# Patient Record
Sex: Female | Born: 1940
Health system: Southern US, Community
[De-identification: ages and names within clinical notes are randomized; demographics above are authoritative.]

## PROBLEM LIST (undated history)

## (undated) DIAGNOSIS — M1990 Unspecified osteoarthritis, unspecified site: Secondary | ICD-10-CM

## (undated) DIAGNOSIS — I10 Essential (primary) hypertension: Secondary | ICD-10-CM

## (undated) DIAGNOSIS — K769 Liver disease, unspecified: Secondary | ICD-10-CM

## (undated) DIAGNOSIS — E039 Hypothyroidism, unspecified: Secondary | ICD-10-CM

## (undated) DIAGNOSIS — E079 Disorder of thyroid, unspecified: Secondary | ICD-10-CM

## (undated) DIAGNOSIS — E119 Type 2 diabetes mellitus without complications: Secondary | ICD-10-CM

## (undated) DIAGNOSIS — I219 Acute myocardial infarction, unspecified: Secondary | ICD-10-CM

## (undated) HISTORY — PX: JOINT REPLACEMENT: SHX530

## (undated) HISTORY — PX: APPENDECTOMY: SHX54

## (undated) HISTORY — PX: ABDOMINAL HYSTERECTOMY: SHX81

---

## 2014-03-23 DIAGNOSIS — Z961 Presence of intraocular lens: Secondary | ICD-10-CM | POA: Diagnosis not present

## 2014-03-23 DIAGNOSIS — E119 Type 2 diabetes mellitus without complications: Secondary | ICD-10-CM | POA: Diagnosis not present

## 2014-03-23 DIAGNOSIS — D3190 Benign neoplasm of unspecified part of unspecified eye: Secondary | ICD-10-CM | POA: Diagnosis not present

## 2014-04-17 DIAGNOSIS — Z961 Presence of intraocular lens: Secondary | ICD-10-CM | POA: Diagnosis not present

## 2014-06-22 DIAGNOSIS — I10 Essential (primary) hypertension: Secondary | ICD-10-CM | POA: Diagnosis not present

## 2014-06-22 DIAGNOSIS — N183 Chronic kidney disease, stage 3 (moderate): Secondary | ICD-10-CM | POA: Diagnosis not present

## 2014-06-22 DIAGNOSIS — E039 Hypothyroidism, unspecified: Secondary | ICD-10-CM | POA: Diagnosis not present

## 2014-06-22 DIAGNOSIS — J209 Acute bronchitis, unspecified: Secondary | ICD-10-CM | POA: Diagnosis not present

## 2014-07-18 DIAGNOSIS — M79672 Pain in left foot: Secondary | ICD-10-CM | POA: Diagnosis not present

## 2014-07-23 DIAGNOSIS — E119 Type 2 diabetes mellitus without complications: Secondary | ICD-10-CM | POA: Diagnosis not present

## 2014-07-23 DIAGNOSIS — E780 Pure hypercholesterolemia: Secondary | ICD-10-CM | POA: Diagnosis not present

## 2014-07-23 DIAGNOSIS — I10 Essential (primary) hypertension: Secondary | ICD-10-CM | POA: Diagnosis not present

## 2014-07-23 DIAGNOSIS — E039 Hypothyroidism, unspecified: Secondary | ICD-10-CM | POA: Diagnosis not present

## 2014-08-02 DIAGNOSIS — M069 Rheumatoid arthritis, unspecified: Secondary | ICD-10-CM | POA: Diagnosis not present

## 2014-08-02 DIAGNOSIS — M0579 Rheumatoid arthritis with rheumatoid factor of multiple sites without organ or systems involvement: Secondary | ICD-10-CM | POA: Diagnosis not present

## 2014-08-02 DIAGNOSIS — M810 Age-related osteoporosis without current pathological fracture: Secondary | ICD-10-CM | POA: Diagnosis not present

## 2014-08-02 DIAGNOSIS — Z79899 Other long term (current) drug therapy: Secondary | ICD-10-CM | POA: Diagnosis not present

## 2014-08-22 DIAGNOSIS — E780 Pure hypercholesterolemia: Secondary | ICD-10-CM | POA: Diagnosis not present

## 2014-08-22 DIAGNOSIS — E039 Hypothyroidism, unspecified: Secondary | ICD-10-CM | POA: Diagnosis not present

## 2014-08-22 DIAGNOSIS — E119 Type 2 diabetes mellitus without complications: Secondary | ICD-10-CM | POA: Diagnosis not present

## 2014-08-30 DIAGNOSIS — M069 Rheumatoid arthritis, unspecified: Secondary | ICD-10-CM | POA: Diagnosis not present

## 2014-09-04 DIAGNOSIS — M810 Age-related osteoporosis without current pathological fracture: Secondary | ICD-10-CM | POA: Diagnosis not present

## 2014-09-22 DIAGNOSIS — K589 Irritable bowel syndrome without diarrhea: Secondary | ICD-10-CM | POA: Diagnosis not present

## 2014-09-22 DIAGNOSIS — E782 Mixed hyperlipidemia: Secondary | ICD-10-CM | POA: Diagnosis not present

## 2014-09-22 DIAGNOSIS — E039 Hypothyroidism, unspecified: Secondary | ICD-10-CM | POA: Diagnosis not present

## 2014-09-22 DIAGNOSIS — E119 Type 2 diabetes mellitus without complications: Secondary | ICD-10-CM | POA: Diagnosis not present

## 2014-09-27 DIAGNOSIS — M069 Rheumatoid arthritis, unspecified: Secondary | ICD-10-CM | POA: Diagnosis not present

## 2014-10-02 DIAGNOSIS — E119 Type 2 diabetes mellitus without complications: Secondary | ICD-10-CM | POA: Diagnosis not present

## 2014-10-02 DIAGNOSIS — E039 Hypothyroidism, unspecified: Secondary | ICD-10-CM | POA: Diagnosis not present

## 2014-10-02 DIAGNOSIS — I10 Essential (primary) hypertension: Secondary | ICD-10-CM | POA: Diagnosis not present

## 2014-10-02 DIAGNOSIS — Z79899 Other long term (current) drug therapy: Secondary | ICD-10-CM | POA: Diagnosis not present

## 2014-10-02 DIAGNOSIS — E1169 Type 2 diabetes mellitus with other specified complication: Secondary | ICD-10-CM | POA: Diagnosis not present

## 2014-10-02 DIAGNOSIS — E780 Pure hypercholesterolemia: Secondary | ICD-10-CM | POA: Diagnosis not present

## 2014-10-10 DIAGNOSIS — Z1231 Encounter for screening mammogram for malignant neoplasm of breast: Secondary | ICD-10-CM | POA: Diagnosis not present

## 2014-10-25 DIAGNOSIS — M069 Rheumatoid arthritis, unspecified: Secondary | ICD-10-CM | POA: Diagnosis not present

## 2014-10-25 DIAGNOSIS — Z79899 Other long term (current) drug therapy: Secondary | ICD-10-CM | POA: Diagnosis not present

## 2014-11-17 DIAGNOSIS — Z23 Encounter for immunization: Secondary | ICD-10-CM | POA: Diagnosis not present

## 2014-11-22 DIAGNOSIS — M810 Age-related osteoporosis without current pathological fracture: Secondary | ICD-10-CM | POA: Diagnosis not present

## 2014-11-22 DIAGNOSIS — M069 Rheumatoid arthritis, unspecified: Secondary | ICD-10-CM | POA: Diagnosis not present

## 2014-12-05 DIAGNOSIS — E785 Hyperlipidemia, unspecified: Secondary | ICD-10-CM | POA: Diagnosis not present

## 2014-12-05 DIAGNOSIS — E119 Type 2 diabetes mellitus without complications: Secondary | ICD-10-CM | POA: Diagnosis not present

## 2014-12-05 DIAGNOSIS — I2510 Atherosclerotic heart disease of native coronary artery without angina pectoris: Secondary | ICD-10-CM | POA: Diagnosis not present

## 2014-12-05 DIAGNOSIS — I10 Essential (primary) hypertension: Secondary | ICD-10-CM | POA: Diagnosis not present

## 2014-12-20 DIAGNOSIS — M069 Rheumatoid arthritis, unspecified: Secondary | ICD-10-CM | POA: Diagnosis not present

## 2014-12-26 DIAGNOSIS — N3091 Cystitis, unspecified with hematuria: Secondary | ICD-10-CM | POA: Diagnosis not present

## 2014-12-26 DIAGNOSIS — N3090 Cystitis, unspecified without hematuria: Secondary | ICD-10-CM | POA: Diagnosis not present

## 2015-01-01 DIAGNOSIS — M545 Low back pain: Secondary | ICD-10-CM | POA: Diagnosis not present

## 2015-01-01 DIAGNOSIS — E039 Hypothyroidism, unspecified: Secondary | ICD-10-CM | POA: Diagnosis not present

## 2015-01-18 DIAGNOSIS — M069 Rheumatoid arthritis, unspecified: Secondary | ICD-10-CM | POA: Diagnosis not present

## 2015-01-19 DIAGNOSIS — R8299 Other abnormal findings in urine: Secondary | ICD-10-CM | POA: Diagnosis not present

## 2015-01-19 DIAGNOSIS — N390 Urinary tract infection, site not specified: Secondary | ICD-10-CM | POA: Diagnosis not present

## 2015-01-19 DIAGNOSIS — M545 Low back pain: Secondary | ICD-10-CM | POA: Diagnosis not present

## 2015-01-19 DIAGNOSIS — R0781 Pleurodynia: Secondary | ICD-10-CM | POA: Diagnosis not present

## 2015-01-19 DIAGNOSIS — S22070A Wedge compression fracture of T9-T10 vertebra, initial encounter for closed fracture: Secondary | ICD-10-CM | POA: Diagnosis not present

## 2015-01-19 DIAGNOSIS — M5136 Other intervertebral disc degeneration, lumbar region: Secondary | ICD-10-CM | POA: Diagnosis not present

## 2015-01-19 DIAGNOSIS — E039 Hypothyroidism, unspecified: Secondary | ICD-10-CM | POA: Diagnosis not present

## 2017-08-05 ENCOUNTER — Emergency Department (HOSPITAL_COMMUNITY): Payer: Medicare Other

## 2017-08-05 ENCOUNTER — Encounter (HOSPITAL_COMMUNITY): Payer: Self-pay | Admitting: Emergency Medicine

## 2017-08-05 ENCOUNTER — Inpatient Hospital Stay (HOSPITAL_COMMUNITY)
Admission: EM | Admit: 2017-08-05 | Discharge: 2017-08-08 | DRG: 481 | Disposition: A | Discharge status: Home Health | Payer: Medicare Other | Attending: Internal Medicine | Admitting: Internal Medicine

## 2017-08-05 ENCOUNTER — Other Ambulatory Visit: Payer: Self-pay

## 2017-08-05 DIAGNOSIS — S7290XA Unspecified fracture of unspecified femur, initial encounter for closed fracture: Secondary | ICD-10-CM | POA: Diagnosis present

## 2017-08-05 DIAGNOSIS — Z888 Allergy status to other drugs, medicaments and biological substances status: Secondary | ICD-10-CM

## 2017-08-05 DIAGNOSIS — S81812A Laceration without foreign body, left lower leg, initial encounter: Secondary | ICD-10-CM | POA: Diagnosis not present

## 2017-08-05 DIAGNOSIS — S72332A Displaced oblique fracture of shaft of left femur, initial encounter for closed fracture: Principal | ICD-10-CM | POA: Diagnosis present

## 2017-08-05 DIAGNOSIS — K219 Gastro-esophageal reflux disease without esophagitis: Secondary | ICD-10-CM | POA: Diagnosis present

## 2017-08-05 DIAGNOSIS — E1151 Type 2 diabetes mellitus with diabetic peripheral angiopathy without gangrene: Secondary | ICD-10-CM | POA: Diagnosis present

## 2017-08-05 DIAGNOSIS — S52502A Unspecified fracture of the lower end of left radius, initial encounter for closed fracture: Secondary | ICD-10-CM | POA: Diagnosis present

## 2017-08-05 DIAGNOSIS — S61512A Laceration without foreign body of left wrist, initial encounter: Secondary | ICD-10-CM | POA: Diagnosis present

## 2017-08-05 DIAGNOSIS — I2510 Atherosclerotic heart disease of native coronary artery without angina pectoris: Secondary | ICD-10-CM | POA: Diagnosis present

## 2017-08-05 DIAGNOSIS — W109XXA Fall (on) (from) unspecified stairs and steps, initial encounter: Secondary | ICD-10-CM | POA: Diagnosis present

## 2017-08-05 DIAGNOSIS — I10 Essential (primary) hypertension: Secondary | ICD-10-CM | POA: Diagnosis present

## 2017-08-05 DIAGNOSIS — D62 Acute posthemorrhagic anemia: Secondary | ICD-10-CM | POA: Diagnosis not present

## 2017-08-05 DIAGNOSIS — Z886 Allergy status to analgesic agent status: Secondary | ICD-10-CM

## 2017-08-05 DIAGNOSIS — I252 Old myocardial infarction: Secondary | ICD-10-CM

## 2017-08-05 DIAGNOSIS — E039 Hypothyroidism, unspecified: Secondary | ICD-10-CM | POA: Diagnosis present

## 2017-08-05 DIAGNOSIS — W19XXXA Unspecified fall, initial encounter: Secondary | ICD-10-CM | POA: Diagnosis present

## 2017-08-05 DIAGNOSIS — T1490XA Injury, unspecified, initial encounter: Secondary | ICD-10-CM

## 2017-08-05 DIAGNOSIS — Z885 Allergy status to narcotic agent status: Secondary | ICD-10-CM

## 2017-08-05 DIAGNOSIS — E119 Type 2 diabetes mellitus without complications: Secondary | ICD-10-CM

## 2017-08-05 DIAGNOSIS — E663 Overweight: Secondary | ICD-10-CM | POA: Diagnosis present

## 2017-08-05 DIAGNOSIS — Z419 Encounter for procedure for purposes other than remedying health state, unspecified: Secondary | ICD-10-CM

## 2017-08-05 DIAGNOSIS — S52572A Other intraarticular fracture of lower end of left radius, initial encounter for closed fracture: Secondary | ICD-10-CM | POA: Diagnosis present

## 2017-08-05 DIAGNOSIS — Z79899 Other long term (current) drug therapy: Secondary | ICD-10-CM

## 2017-08-05 DIAGNOSIS — S52612A Displaced fracture of left ulna styloid process, initial encounter for closed fracture: Secondary | ICD-10-CM | POA: Diagnosis present

## 2017-08-05 DIAGNOSIS — Z7982 Long term (current) use of aspirin: Secondary | ICD-10-CM

## 2017-08-05 DIAGNOSIS — Z87891 Personal history of nicotine dependence: Secondary | ICD-10-CM

## 2017-08-05 DIAGNOSIS — Z23 Encounter for immunization: Secondary | ICD-10-CM

## 2017-08-05 DIAGNOSIS — D72829 Elevated white blood cell count, unspecified: Secondary | ICD-10-CM | POA: Diagnosis present

## 2017-08-05 DIAGNOSIS — Z6829 Body mass index (BMI) 29.0-29.9, adult: Secondary | ICD-10-CM

## 2017-08-05 DIAGNOSIS — R718 Other abnormality of red blood cells: Secondary | ICD-10-CM | POA: Diagnosis present

## 2017-08-05 DIAGNOSIS — J449 Chronic obstructive pulmonary disease, unspecified: Secondary | ICD-10-CM | POA: Diagnosis present

## 2017-08-05 DIAGNOSIS — Z7984 Long term (current) use of oral hypoglycemic drugs: Secondary | ICD-10-CM

## 2017-08-05 DIAGNOSIS — S52592A Other fractures of lower end of left radius, initial encounter for closed fracture: Secondary | ICD-10-CM

## 2017-08-05 HISTORY — DX: Essential (primary) hypertension: I10

## 2017-08-05 HISTORY — DX: Liver disease, unspecified: K76.9

## 2017-08-05 HISTORY — DX: Disorder of thyroid, unspecified: E07.9

## 2017-08-05 HISTORY — DX: Unspecified osteoarthritis, unspecified site: M19.90

## 2017-08-05 HISTORY — DX: Type 2 diabetes mellitus without complications: E11.9

## 2017-08-05 HISTORY — DX: Hypothyroidism, unspecified: E03.9

## 2017-08-05 HISTORY — DX: Acute myocardial infarction, unspecified: I21.9

## 2017-08-05 MED ORDER — ONDANSETRON HCL 4 MG/2ML IJ SOLN
4.0000 mg | Freq: Once | INTRAMUSCULAR | Status: AC
  Administered 2017-08-05: 4 mg via INTRAVENOUS
  Filled 2017-08-05: qty 2

## 2017-08-05 MED ORDER — TETANUS-DIPHTH-ACELL PERTUSSIS 5-2.5-18.5 LF-MCG/0.5 IM SUSP
0.5000 mL | Freq: Once | INTRAMUSCULAR | Status: AC
  Administered 2017-08-05: 0.5 mL via INTRAMUSCULAR
  Filled 2017-08-05: qty 0.5

## 2017-08-05 MED ORDER — FENTANYL CITRATE (PF) 100 MCG/2ML IJ SOLN
50.0000 ug | Freq: Once | INTRAMUSCULAR | Status: AC
Start: 2017-08-05 — End: 2017-08-05
  Administered 2017-08-05: 50 ug via INTRAVENOUS
  Filled 2017-08-05: qty 2

## 2017-08-05 NOTE — ED Triage Notes (Signed)
Pt reports she was on the last step of the stairs at her granddaughters house when she went to spin, felt a pain in her left femur area, and then fell. Pt has a hx of a hip fx back in January. Pt denies any LOC.

## 2017-08-05 NOTE — ED Provider Notes (Signed)
[facility] Provider Note   CSN: [id number] Arrival date & time: 08/05/17  2247     History   Chief Complaint Chief Complaint  Patient presents with  . Fall  . Possible Femur Fx    HPI Crystal Pham is a 77 y.o. female.  The history is provided by the patient and a relative.  Fall  This is a new problem. The current episode started 1 to 2 hours ago. The problem occurs constantly. The problem has been gradually worsening. Pertinent negatives include no chest pain, no abdominal pain and no headaches. The symptoms are aggravated by walking. The symptoms are relieved by rest.   Patient presents after fall. She reports losing her balance on stairs and falling on her left side.  She reports significant pain in her left thigh.  She also reports pain in her left shin.  She did hit her head, but denies LOC.  Denies headache, neck pain, back pain.  No vomiting prior to EMS transport.  No chest pain or abdominal pain.  She does not take anticoagulants She reports previous history of right hip surgery in [place] earlier this year Past Medical History:  Diagnosis Date  . Arthritis   . Diabetes mellitus without complication (HCC)    Type II  . Heart attack (HCC)   . Hypertension   . Liver disorder   . Thyroid disease     There are no active problems to display for this patient.   Past Surgical History:  Procedure Laterality Date  . ABDOMINAL HYSTERECTOMY    . APPENDECTOMY    . JOINT REPLACEMENT       OB History   None      Home Medications    Prior to Admission medications   Not on File    Family History No family history on file.  Social History Social History   Tobacco Use  . Smoking status: Former Games developermoker  . Smokeless tobacco: Never Used  Substance Use Topics  . Alcohol use: Never    Frequency: Never  . Drug use: Never     Allergies   Oxycodone; Promethazine; and Levofloxacin   Review of Systems Review of  Systems  Constitutional: Negative for fever.  Cardiovascular: Negative for chest pain.  Gastrointestinal: Negative for abdominal pain.  Musculoskeletal: Positive for arthralgias. Negative for back pain and neck pain.  Skin: Positive for wound.  Neurological: Negative for syncope and headaches.  All other systems reviewed and are negative.    Physical Exam Updated Vital Signs BP (!) 146/76 (BP Location: Left Arm)   Pulse 79   Temp 98.2 F (36.8 C) (Oral)   Resp 18   Ht 1.524 m (5')   Wt 68.9 kg (152 lb)   SpO2 93%   BMI 29.69 kg/m   Physical Exam CONSTITUTIONAL: Elderly, no acute distress HEAD: Normocephalic/atraumatic EYES: EOMI/PERRL ENMT: Mucous membranes moist, small abrasion to bridge of nose.  No facial tenderness.  No malocclusion.  No other nasal injury noted NECK: supple no meningeal signs SPINE/BACK:entire spine nontender no bruising/crepitance/stepoffs noted to spine CV: S1/S2 noted, no murmurs/rubs/gallops noted LUNGS: Lungs are clear to auscultation bilaterally, no apparent distress ABDOMEN: soft, nontender, no bruising NEURO: Pt is awake/alert/appropriate, moves all extremitiesx4.  No facial droop.   EXTREMITIES: pulses normal/equal both feet Left lower extremity is in traction.  Distal pulses intact.  Diffuse tenderness to left thigh.  No lacerations noted.  Diffuse tenderness to left tibial surface.  No  deformities.  There is no left foot tenderness.  Full range of motion of right hip without difficulty 1 cm laceration ulnar aspect of left wrist.  No other wrist tenderness All other extremities/joints palpated/ranged and nontender SKIN: warm, color normal PSYCH: no abnormalities of mood noted, alert and oriented to situation    Patient gave verbal permission to utilize photo for medical documentation only The image was not stored on any personal device  ED Treatments / Results  Labs (all labs ordered are listed, but only abnormal results are  displayed) Labs Reviewed  BASIC METABOLIC PANEL - Abnormal; Notable for the following components:      Result Value   Glucose, Bld 153 (*)    GFR calc non Af Amer 55 (*)    All other components within normal limits  CBC WITH DIFFERENTIAL/PLATELET - Abnormal; Notable for the following components:   WBC 12.6 (*)    RBC 3.46 (*)    Hemoglobin 10.9 (*)    HCT 35.1 (*)    MCV 101.4 (*)    Neutro Abs 8.9 (*)    Monocytes Absolute 1.5 (*)    Abs Immature Granulocytes 0.2 (*)    All other components within normal limits  PROTIME-INR  TYPE AND SCREEN  ABO/RH    EKG EKG Interpretation  Date/Time:  Thursday August 06 2017 00:57:19 EDT Ventricular Rate:  75 PR Interval:    QRS Duration: 80 QT Interval:  403 QTC Calculation: 451 R Axis:   49 Text Interpretation:  Sinus rhythm No previous ECGs available Confirmed by Zadie Rhine ([id number]) on 08/06/2017 1:01:27 AM   Radiology Dg Wrist Complete Left  Result Date: 08/06/2017 CLINICAL DATA:  77 y/o  F; fall with wrist pain. EXAM: LEFT WRIST - COMPLETE 3+ VIEW COMPARISON:  None. FINDINGS: Acute comminuted intra-articular impacted fracture of the distal radius with mild dorsal apex angulation. Acute mildly displaced fracture of the ulnar styloid. Normal radiocarpal articulation. No additional fracture or dislocation identified. Bones are demineralized. Vascular calcifications noted. IMPRESSION: 1. Acute comminuted intra-articular impacted fracture of distal radius with mild dorsal apex angulation. 2. Acute mildly displaced fracture of the ulnar styloid. Electronically Signed   By: Mitzi Hansen M.D.   On: 08/06/2017 01:07   Dg Tibia/fibula Left  Result Date: 08/06/2017 CLINICAL DATA:  77 y/o  F; fall with left hip pain. EXAM: DG HIP (WITH OR WITHOUT PELVIS) 2-3V LEFT; LEFT FEMUR 2 VIEWS; LEFT TIBIA AND FIBULA - 2 VIEW COMPARISON:  None. FINDINGS: Left hip: Partially visualized right femur intramedullary nail and proximal lag screw, no  periprosthetic lucency or fracture identified. Hip joints are well maintained. No pelvic fracture or diastasis. Degenerative changes of the lower lumbar spine. Oblique overriding angulated and 1 shaft's with displaced fracture of the mid left femur diaphysis. Left femur: Acute oblique fracture of the mid left femur diaphysis with 5 cm overriding, mild dorsal and medial apex angulation, and 1 shaft's width posterior displacement of the distal fracture component. Knee joint is grossly maintained. Left tibia and fibula: No acute fracture or dislocation of the tibia and fibula. Partially visualized knee and ankle joints are grossly maintained. IMPRESSION: 1. Acute oblique fracture of the mid left femur diaphysis with 5 cm overriding, mild dorsal and medial apex angulation, and 1 shaft's width posterior displacement of the distal fracture component. 2. No additional fracture or dislocation identified. Electronically Signed   By: Mitzi Hansen M.D.   On: 08/06/2017 01:05   Dg Hip Unilat With Pelvis 2-3  Views Left  Result Date: 08/06/2017 CLINICAL DATA:  77 y/o  F; fall with left hip pain. EXAM: DG HIP (WITH OR WITHOUT PELVIS) 2-3V LEFT; LEFT FEMUR 2 VIEWS; LEFT TIBIA AND FIBULA - 2 VIEW COMPARISON:  None. FINDINGS: Left hip: Partially visualized right femur intramedullary nail and proximal lag screw, no periprosthetic lucency or fracture identified. Hip joints are well maintained. No pelvic fracture or diastasis. Degenerative changes of the lower lumbar spine. Oblique overriding angulated and 1 shaft's with displaced fracture of the mid left femur diaphysis. Left femur: Acute oblique fracture of the mid left femur diaphysis with 5 cm overriding, mild dorsal and medial apex angulation, and 1 shaft's width posterior displacement of the distal fracture component. Knee joint is grossly maintained. Left tibia and fibula: No acute fracture or dislocation of the tibia and fibula. Partially visualized knee and  ankle joints are grossly maintained. IMPRESSION: 1. Acute oblique fracture of the mid left femur diaphysis with 5 cm overriding, mild dorsal and medial apex angulation, and 1 shaft's width posterior displacement of the distal fracture component. 2. No additional fracture or dislocation identified. Electronically Signed   By: Mitzi Hansen M.D.   On: 08/06/2017 01:05   Dg Femur Min 2 Views Left  Result Date: 08/06/2017 CLINICAL DATA:  77 y/o  F; fall with left hip pain. EXAM: DG HIP (WITH OR WITHOUT PELVIS) 2-3V LEFT; LEFT FEMUR 2 VIEWS; LEFT TIBIA AND FIBULA - 2 VIEW COMPARISON:  None. FINDINGS: Left hip: Partially visualized right femur intramedullary nail and proximal lag screw, no periprosthetic lucency or fracture identified. Hip joints are well maintained. No pelvic fracture or diastasis. Degenerative changes of the lower lumbar spine. Oblique overriding angulated and 1 shaft's with displaced fracture of the mid left femur diaphysis. Left femur: Acute oblique fracture of the mid left femur diaphysis with 5 cm overriding, mild dorsal and medial apex angulation, and 1 shaft's width posterior displacement of the distal fracture component. Knee joint is grossly maintained. Left tibia and fibula: No acute fracture or dislocation of the tibia and fibula. Partially visualized knee and ankle joints are grossly maintained. IMPRESSION: 1. Acute oblique fracture of the mid left femur diaphysis with 5 cm overriding, mild dorsal and medial apex angulation, and 1 shaft's width posterior displacement of the distal fracture component. 2. No additional fracture or dislocation identified. Electronically Signed   By: Mitzi Hansen M.D.   On: 08/06/2017 01:05    Procedures .Marland KitchenLaceration Repair Date/Time: 08/06/2017 1:53 AM Performed by: Zadie Rhine, MD Authorized by: Zadie Rhine, MD   Consent:    Consent obtained:  Verbal   Consent given by:  Patient Anesthesia (see MAR for exact  dosages):    Anesthesia method:  Local infiltration   Local anesthetic:  Lidocaine 1% WITH epi Laceration details:    Location:  Hand   Hand location:  L wrist   Length (cm):  1 Repair type:    Repair type:  Intermediate Pre-procedure details:    Preparation:  Patient was prepped and draped in usual sterile fashion Exploration:    Wound exploration: entire depth of wound probed and visualized     Contaminated: no   Treatment:    Area cleansed with:  Betadine and Shur-Clens   Amount of cleaning:  Standard Skin repair:    Repair method:  Sutures   Suture size:  4-0   Suture material:  Prolene   Suture technique:  Simple interrupted   Number of sutures:  4 Approximation:  Approximation:  Close Post-procedure details:    Patient tolerance of procedure:  Tolerated well, no immediate complications Comments:     No bone or deep structures exposed  SPLINT APPLICATION Date/Time: 2:49 AM Authorized by: Joya Gaskins Consent: Verbal consent obtained. Risks and benefits: risks, benefits and alternatives were discussed Consent given by: patient Splint applied by: orthopedic technician Location details: left wrist Splint type: sugartong Supplies used: ortho glass Post-procedure: The splinted body part was neurovascularly unchanged following the procedure. Patient tolerance: Patient tolerated the procedure well with no immediate complications.    Medications Ordered in ED Medications  Tdap (BOOSTRIX) injection 0.5 mL (0.5 mLs Intramuscular Given 08/05/17 2333)  fentaNYL (SUBLIMAZE) injection 50 mcg (50 mcg Intravenous Given 08/05/17 2334)  ondansetron (ZOFRAN) injection 4 mg (4 mg Intravenous Given 08/05/17 2334)  ondansetron (ZOFRAN) injection 4 mg (4 mg Intravenous Given 08/06/17 0052)  lidocaine-EPINEPHrine (XYLOCAINE W/EPI) 2 %-1:200000 (PF) injection 10 mL (10 mLs Infiltration Given 08/06/17 0052)  ceFAZolin (ANCEF) IVPB 1 g/50 mL premix (1 g Intravenous New Bag/Given  08/06/17 0201)  fentaNYL (SUBLIMAZE) injection 50 mcg (50 mcg Intravenous Given 08/06/17 0155)     Initial Impression / Assessment and Plan / ED Course  I have reviewed the triage vital signs and the nursing notes.  Pertinent labs & imaging results that were available during my care of the patient were reviewed by me and considered in my medical decision making (see chart for details).     11:25 PM Patient presents after fall.  Per EMS she had a left thigh deformity and traction was placed in route.  I will leave this in place for now as patient is comfortable.  Will obtain imaging. Defer CT head/C-spine as she has no C-spine tenderness and no LOC or headache. She will need laceration repair of left wrist. She did vomit in route, but this is mainly after receiving pain medicine from EMS 12:37 AM Discussed with Dr. Jena Gauss with orthopedics.  He recommends Buck's traction 10 pounds.  Admit to medicine he will see later this morning, plan for surgery today. 1:52 AM Found to have minimally displaced distal radius fracture, as well as ulnar styloid fracture.  She has a laceration on the ulnar surface of left wrist, there is no bone exposed. Repair of the wound without incident.  We will consult hand. 2:49 AM Discussed with Dr. Mina Marble concerning her left distal radius fracture.  We discussed location of laceration, and fracture. No evidence of obvious open fracture. She will be splinted.  He requested to speak with Dr. Jena Gauss concerning potentially having this managed at the same time as her left femur fracture later today  Patient is resting comfortably, she has had Buck's traction applied and is doing well with distal pulses intact.  She also has sugar tong splint to left arm and is resting comfortably  Discussed with Dr. Clyde Lundborg for admission Final Clinical Impressions(s) / ED Diagnoses   Final diagnoses:  Fall, initial encounter  Closed displaced oblique fracture of shaft of left femur,  initial encounter (HCC)  Laceration of left wrist, initial encounter  Other closed fracture of distal end of left radius, initial encounter    ED Discharge Orders    None       Zadie Rhine, MD 08/06/17 [id number]

## 2017-08-06 ENCOUNTER — Encounter (HOSPITAL_COMMUNITY): Payer: Self-pay | Admitting: Internal Medicine

## 2017-08-06 ENCOUNTER — Emergency Department (HOSPITAL_COMMUNITY): Payer: Medicare Other

## 2017-08-06 ENCOUNTER — Inpatient Hospital Stay (HOSPITAL_COMMUNITY): Payer: Medicare Other

## 2017-08-06 ENCOUNTER — Inpatient Hospital Stay (HOSPITAL_COMMUNITY): Payer: Medicare Other | Admitting: Anesthesiology

## 2017-08-06 ENCOUNTER — Encounter (HOSPITAL_COMMUNITY)
Admission: EM | Disposition: A | Discharge status: Home Health | Payer: Self-pay | Source: Home / Self Care | Attending: Internal Medicine

## 2017-08-06 DIAGNOSIS — E119 Type 2 diabetes mellitus without complications: Secondary | ICD-10-CM | POA: Insufficient documentation

## 2017-08-06 DIAGNOSIS — D72829 Elevated white blood cell count, unspecified: Secondary | ICD-10-CM

## 2017-08-06 DIAGNOSIS — R718 Other abnormality of red blood cells: Secondary | ICD-10-CM | POA: Diagnosis present

## 2017-08-06 DIAGNOSIS — J449 Chronic obstructive pulmonary disease, unspecified: Secondary | ICD-10-CM | POA: Diagnosis present

## 2017-08-06 DIAGNOSIS — I2510 Atherosclerotic heart disease of native coronary artery without angina pectoris: Secondary | ICD-10-CM | POA: Diagnosis present

## 2017-08-06 DIAGNOSIS — Z6829 Body mass index (BMI) 29.0-29.9, adult: Secondary | ICD-10-CM | POA: Diagnosis not present

## 2017-08-06 DIAGNOSIS — E039 Hypothyroidism, unspecified: Secondary | ICD-10-CM | POA: Diagnosis present

## 2017-08-06 DIAGNOSIS — S52502K Unspecified fracture of the lower end of left radius, subsequent encounter for closed fracture with nonunion: Secondary | ICD-10-CM

## 2017-08-06 DIAGNOSIS — E663 Overweight: Secondary | ICD-10-CM | POA: Diagnosis present

## 2017-08-06 DIAGNOSIS — I252 Old myocardial infarction: Secondary | ICD-10-CM | POA: Diagnosis not present

## 2017-08-06 DIAGNOSIS — D62 Acute posthemorrhagic anemia: Secondary | ICD-10-CM | POA: Diagnosis not present

## 2017-08-06 DIAGNOSIS — I10 Essential (primary) hypertension: Secondary | ICD-10-CM | POA: Diagnosis present

## 2017-08-06 DIAGNOSIS — S52502A Unspecified fracture of the lower end of left radius, initial encounter for closed fracture: Secondary | ICD-10-CM | POA: Diagnosis present

## 2017-08-06 DIAGNOSIS — S72332A Displaced oblique fracture of shaft of left femur, initial encounter for closed fracture: Principal | ICD-10-CM

## 2017-08-06 DIAGNOSIS — S52572A Other intraarticular fracture of lower end of left radius, initial encounter for closed fracture: Secondary | ICD-10-CM | POA: Diagnosis present

## 2017-08-06 DIAGNOSIS — Z886 Allergy status to analgesic agent status: Secondary | ICD-10-CM | POA: Diagnosis not present

## 2017-08-06 DIAGNOSIS — W19XXXA Unspecified fall, initial encounter: Secondary | ICD-10-CM

## 2017-08-06 DIAGNOSIS — E1151 Type 2 diabetes mellitus with diabetic peripheral angiopathy without gangrene: Secondary | ICD-10-CM | POA: Diagnosis present

## 2017-08-06 DIAGNOSIS — S52592A Other fractures of lower end of left radius, initial encounter for closed fracture: Secondary | ICD-10-CM | POA: Diagnosis not present

## 2017-08-06 DIAGNOSIS — Z87891 Personal history of nicotine dependence: Secondary | ICD-10-CM | POA: Diagnosis not present

## 2017-08-06 DIAGNOSIS — Z888 Allergy status to other drugs, medicaments and biological substances status: Secondary | ICD-10-CM | POA: Diagnosis not present

## 2017-08-06 DIAGNOSIS — Z7984 Long term (current) use of oral hypoglycemic drugs: Secondary | ICD-10-CM | POA: Diagnosis not present

## 2017-08-06 DIAGNOSIS — K219 Gastro-esophageal reflux disease without esophagitis: Secondary | ICD-10-CM | POA: Diagnosis present

## 2017-08-06 DIAGNOSIS — W109XXA Fall (on) (from) unspecified stairs and steps, initial encounter: Secondary | ICD-10-CM | POA: Diagnosis not present

## 2017-08-06 DIAGNOSIS — Z885 Allergy status to narcotic agent status: Secondary | ICD-10-CM | POA: Diagnosis not present

## 2017-08-06 DIAGNOSIS — S7290XA Unspecified fracture of unspecified femur, initial encounter for closed fracture: Secondary | ICD-10-CM | POA: Diagnosis present

## 2017-08-06 DIAGNOSIS — S52612A Displaced fracture of left ulna styloid process, initial encounter for closed fracture: Secondary | ICD-10-CM | POA: Diagnosis present

## 2017-08-06 DIAGNOSIS — Z79899 Other long term (current) drug therapy: Secondary | ICD-10-CM | POA: Diagnosis not present

## 2017-08-06 DIAGNOSIS — S81812A Laceration without foreign body, left lower leg, initial encounter: Secondary | ICD-10-CM | POA: Diagnosis not present

## 2017-08-06 DIAGNOSIS — Z7982 Long term (current) use of aspirin: Secondary | ICD-10-CM | POA: Diagnosis not present

## 2017-08-06 DIAGNOSIS — W19XXXS Unspecified fall, sequela: Secondary | ICD-10-CM

## 2017-08-06 DIAGNOSIS — Z23 Encounter for immunization: Secondary | ICD-10-CM | POA: Diagnosis present

## 2017-08-06 DIAGNOSIS — S61512A Laceration without foreign body of left wrist, initial encounter: Secondary | ICD-10-CM

## 2017-08-06 DIAGNOSIS — S72402K Unspecified fracture of lower end of left femur, subsequent encounter for closed fracture with nonunion: Secondary | ICD-10-CM | POA: Diagnosis not present

## 2017-08-06 HISTORY — PX: FEMUR IM NAIL: SHX1597

## 2017-08-06 HISTORY — PX: OPEN REDUCTION INTERNAL FIXATION (ORIF) DISTAL RADIAL FRACTURE: SHX5989

## 2017-08-06 LAB — BASIC METABOLIC PANEL
ANION GAP: 11 (ref 5–15)
Anion gap: 9 (ref 5–15)
BUN: 13 mg/dL (ref 8–23)
BUN: 14 mg/dL (ref 8–23)
CALCIUM: 9.2 mg/dL (ref 8.9–10.3)
CHLORIDE: 101 mmol/L (ref 98–111)
CO2: 27 mmol/L (ref 22–32)
CO2: 24 mmol/L (ref 22–32)
CREATININE: 0.94 mg/dL (ref 0.44–1.00)
Calcium: 8.5 mg/dL — ABNORMAL LOW (ref 8.9–10.3)
Chloride: 104 mmol/L (ref 98–111)
Creatinine, Ser: 0.96 mg/dL (ref 0.44–1.00)
GFR calc non Af Amer: 55 mL/min — ABNORMAL LOW (ref >60)
GFR, EST NON AFRICAN AMERICAN: 57 mL/min — AB (ref >60)
GLUCOSE: 153 mg/dL — AB (ref 70–99)
Glucose, Bld: 188 mg/dL — ABNORMAL HIGH (ref 70–99)
POTASSIUM: 4.1 mmol/L (ref 3.5–5.1)
Potassium: 4 mmol/L (ref 3.5–5.1)
SODIUM: 137 mmol/L (ref 135–145)
Sodium: 139 mmol/L (ref 135–145)

## 2017-08-06 LAB — ABO/RH: ABO/RH(D): A POS

## 2017-08-06 LAB — CBC WITH DIFFERENTIAL/PLATELET
Abs Immature Granulocytes: 0.2 10*3/uL — ABNORMAL HIGH (ref 0.0–0.1)
BASOS PCT: 1 %
Basophils Absolute: 0.1 10*3/uL (ref 0.0–0.1)
Eosinophils Absolute: 0.1 10*3/uL (ref 0.0–0.7)
Eosinophils Relative: 1 %
HEMATOCRIT: 35.1 % — AB (ref 36.0–46.0)
HEMOGLOBIN: 10.9 g/dL — AB (ref 12.0–15.0)
IMMATURE GRANULOCYTES: 1 %
LYMPHS ABS: 1.8 10*3/uL (ref 0.7–4.0)
LYMPHS PCT: 15 %
MCH: 31.5 pg (ref 26.0–34.0)
MCHC: 31.1 g/dL (ref 30.0–36.0)
MCV: 101.4 fL — AB (ref 78.0–100.0)
MONO ABS: 1.5 10*3/uL — AB (ref 0.1–1.0)
MONOS PCT: 12 %
NEUTROS PCT: 70 %
Neutro Abs: 8.9 10*3/uL — ABNORMAL HIGH (ref 1.7–7.7)
PLATELETS: 214 10*3/uL (ref 150–400)
RBC: 3.46 MIL/uL — ABNORMAL LOW (ref 3.87–5.11)
RDW: 14.5 % (ref 11.5–15.5)
WBC: 12.6 10*3/uL — ABNORMAL HIGH (ref 4.0–10.5)

## 2017-08-06 LAB — URINALYSIS, ROUTINE W REFLEX MICROSCOPIC
BILIRUBIN URINE: NEGATIVE
Bacteria, UA: NONE SEEN
GLUCOSE, UA: 50 mg/dL — AB
Hgb urine dipstick: NEGATIVE
Ketones, ur: NEGATIVE mg/dL
Leukocytes, UA: NEGATIVE
Nitrite: NEGATIVE
PROTEIN: 30 mg/dL — AB
Specific Gravity, Urine: 1.016 (ref 1.005–1.030)
pH: 6 (ref 5.0–8.0)

## 2017-08-06 LAB — PROTIME-INR
INR: 0.96
Prothrombin Time: 12.7 seconds (ref 11.4–15.2)

## 2017-08-06 LAB — CBC
HCT: 34.7 % — ABNORMAL LOW (ref 36.0–46.0)
Hemoglobin: 10.9 g/dL — ABNORMAL LOW (ref 12.0–15.0)
MCH: 31.7 pg (ref 26.0–34.0)
MCHC: 31.4 g/dL (ref 30.0–36.0)
MCV: 100.9 fL — ABNORMAL HIGH (ref 78.0–100.0)
PLATELETS: 193 10*3/uL (ref 150–400)
RBC: 3.44 MIL/uL — AB (ref 3.87–5.11)
RDW: 14.3 % (ref 11.5–15.5)
WBC: 15.5 10*3/uL — ABNORMAL HIGH (ref 4.0–10.5)

## 2017-08-06 LAB — SURGICAL PCR SCREEN
MRSA, PCR: NEGATIVE
STAPHYLOCOCCUS AUREUS: NEGATIVE

## 2017-08-06 LAB — GLUCOSE, CAPILLARY
GLUCOSE-CAPILLARY: 155 mg/dL — AB (ref 70–99)
GLUCOSE-CAPILLARY: 110 mg/dL — AB (ref 70–99)
Glucose-Capillary: 142 mg/dL — ABNORMAL HIGH (ref 70–99)
Glucose-Capillary: 187 mg/dL — ABNORMAL HIGH (ref 70–99)
Glucose-Capillary: 305 mg/dL — ABNORMAL HIGH (ref 70–99)

## 2017-08-06 LAB — TYPE AND SCREEN
ABO/RH(D): A POS
ANTIBODY SCREEN: NEGATIVE

## 2017-08-06 LAB — APTT: aPTT: 24 seconds (ref 24–36)

## 2017-08-06 SURGERY — INSERTION, INTRAMEDULLARY ROD, FEMUR
Anesthesia: General | Laterality: Left

## 2017-08-06 MED ORDER — ENOXAPARIN SODIUM 40 MG/0.4ML ~~LOC~~ SOLN
40.0000 mg | SUBCUTANEOUS | Status: DC
  Administered 2017-08-07 – 2017-08-08 (×2): 40 mg via SUBCUTANEOUS
  Filled 2017-08-06 (×2): qty 0.4

## 2017-08-06 MED ORDER — IBUPROFEN 200 MG PO TABS: 200.0000 mg | ORAL_TABLET | ORAL | Status: DC | PRN

## 2017-08-06 MED ORDER — BACITRACIN ZINC 500 UNIT/GM EX OINT
TOPICAL_OINTMENT | CUTANEOUS | Status: AC
  Filled 2017-08-06: qty 28.35

## 2017-08-06 MED ORDER — METOPROLOL TARTRATE 25 MG PO TABS
25.0000 mg | ORAL_TABLET | Freq: Two times a day (BID) | ORAL | Status: DC
  Administered 2017-08-06 – 2017-08-08 (×5): 25 mg via ORAL
  Filled 2017-08-06 (×5): qty 1

## 2017-08-06 MED ORDER — SUGAMMADEX SODIUM 200 MG/2ML IV SOLN
INTRAVENOUS | Status: DC | PRN
  Administered 2017-08-06: 150 mg via INTRAVENOUS

## 2017-08-06 MED ORDER — CEFAZOLIN SODIUM-DEXTROSE 2-4 GM/100ML-% IV SOLN
2.0000 g | INTRAVENOUS | Status: AC
  Administered 2017-08-06: 2 g via INTRAVENOUS
  Filled 2017-08-06: qty 100

## 2017-08-06 MED ORDER — PROPOFOL 10 MG/ML IV BOLUS
INTRAVENOUS | Status: DC | PRN
  Administered 2017-08-06: 110 mg via INTRAVENOUS

## 2017-08-06 MED ORDER — LOSARTAN POTASSIUM 50 MG PO TABS
50.0000 mg | ORAL_TABLET | Freq: Every day | ORAL | Status: DC
  Administered 2017-08-07 – 2017-08-08 (×2): 50 mg via ORAL
  Filled 2017-08-06 (×2): qty 1

## 2017-08-06 MED ORDER — SUCCINYLCHOLINE CHLORIDE 200 MG/10ML IV SOSY
PREFILLED_SYRINGE | INTRAVENOUS | Status: AC
  Filled 2017-08-06: qty 10

## 2017-08-06 MED ORDER — DEXAMETHASONE SODIUM PHOSPHATE 10 MG/ML IJ SOLN
INTRAMUSCULAR | Status: DC | PRN
  Administered 2017-08-06: 10 mg via INTRAVENOUS

## 2017-08-06 MED ORDER — CEFAZOLIN SODIUM-DEXTROSE 2-4 GM/100ML-% IV SOLN
2.0000 g | Freq: Three times a day (TID) | INTRAVENOUS | Status: AC
  Administered 2017-08-06 – 2017-08-07 (×3): 2 g via INTRAVENOUS
  Filled 2017-08-06 (×3): qty 100

## 2017-08-06 MED ORDER — DEXTROSE 5 % IV SOLN
INTRAVENOUS | Status: DC | PRN
  Administered 2017-08-06: 25 ug/min via INTRAVENOUS

## 2017-08-06 MED ORDER — ROCURONIUM BROMIDE 50 MG/5ML IV SOLN
INTRAVENOUS | Status: AC
  Filled 2017-08-06: qty 1

## 2017-08-06 MED ORDER — ONDANSETRON HCL 4 MG/2ML IJ SOLN: 4.0000 mg | Freq: Once | INTRAMUSCULAR | Status: DC | PRN

## 2017-08-06 MED ORDER — FENTANYL CITRATE (PF) 100 MCG/2ML IJ SOLN
50.0000 ug | Freq: Once | INTRAMUSCULAR | Status: AC
  Administered 2017-08-06: 50 ug via INTRAVENOUS

## 2017-08-06 MED ORDER — ONDANSETRON HCL 4 MG/2ML IJ SOLN: 4.0000 mg | Freq: Three times a day (TID) | INTRAMUSCULAR | Status: DC | PRN

## 2017-08-06 MED ORDER — ACETAMINOPHEN 325 MG PO TABS
650.0000 mg | ORAL_TABLET | Freq: Four times a day (QID) | ORAL | Status: DC | PRN
  Administered 2017-08-07 – 2017-08-08 (×5): 650 mg via ORAL
  Filled 2017-08-06 (×5): qty 2

## 2017-08-06 MED ORDER — LACTATED RINGERS IV SOLN
INTRAVENOUS | Status: DC
  Administered 2017-08-06: 11:00:00 via INTRAVENOUS

## 2017-08-06 MED ORDER — LIDOCAINE 2% (20 MG/ML) 5 ML SYRINGE
INTRAMUSCULAR | Status: AC
  Filled 2017-08-06: qty 5

## 2017-08-06 MED ORDER — TRAMADOL HCL 50 MG PO TABS
50.0000 mg | ORAL_TABLET | Freq: Four times a day (QID) | ORAL | Status: DC | PRN
  Administered 2017-08-07 – 2017-08-08 (×3): 50 mg via ORAL
  Filled 2017-08-06 (×3): qty 1

## 2017-08-06 MED ORDER — FERROUS SULFATE 325 (65 FE) MG PO TABS
325.0000 mg | ORAL_TABLET | ORAL | Status: DC
  Administered 2017-08-07: 325 mg via ORAL
  Filled 2017-08-06 (×2): qty 1

## 2017-08-06 MED ORDER — METHOCARBAMOL 500 MG PO TABS
500.0000 mg | ORAL_TABLET | Freq: Three times a day (TID) | ORAL | Status: DC | PRN
  Administered 2017-08-07: 500 mg via ORAL
  Filled 2017-08-06: qty 1

## 2017-08-06 MED ORDER — VITAMIN D 1000 UNITS PO TABS
5000.0000 [IU] | ORAL_TABLET | Freq: Every evening | ORAL | Status: DC
  Administered 2017-08-06 – 2017-08-07 (×2): 5000 [IU] via ORAL
  Filled 2017-08-06 (×2): qty 5

## 2017-08-06 MED ORDER — CALCIUM CARBONATE-VITAMIN D 500-200 MG-UNIT PO TABS
1.0000 | ORAL_TABLET | Freq: Every evening | ORAL | Status: DC
  Administered 2017-08-06 – 2017-08-07 (×2): 1 via ORAL
  Filled 2017-08-06 (×2): qty 1

## 2017-08-06 MED ORDER — ONDANSETRON HCL 4 MG/2ML IJ SOLN
INTRAMUSCULAR | Status: DC | PRN
  Administered 2017-08-06: 4 mg via INTRAVENOUS

## 2017-08-06 MED ORDER — INSULIN ASPART 100 UNIT/ML ~~LOC~~ SOLN
0.0000 [IU] | Freq: Three times a day (TID) | SUBCUTANEOUS | Status: DC
  Administered 2017-08-06 – 2017-08-07 (×3): 2 [IU] via SUBCUTANEOUS
  Administered 2017-08-07: 3 [IU] via SUBCUTANEOUS
  Administered 2017-08-07: 1 [IU] via SUBCUTANEOUS

## 2017-08-06 MED ORDER — FENTANYL CITRATE (PF) 100 MCG/2ML IJ SOLN
25.0000 ug | INTRAMUSCULAR | Status: DC | PRN
  Administered 2017-08-06: 25 ug via INTRAVENOUS

## 2017-08-06 MED ORDER — BISMUTH SUBSALICYLATE 262 MG/15ML PO SUSP: 30.0000 mL | ORAL | Status: DC | PRN

## 2017-08-06 MED ORDER — BACITRACIN ZINC 500 UNIT/GM EX OINT
TOPICAL_OINTMENT | CUTANEOUS | Status: DC | PRN
  Administered 2017-08-06: 1 via TOPICAL

## 2017-08-06 MED ORDER — FENTANYL CITRATE (PF) 100 MCG/2ML IJ SOLN
50.0000 ug | Freq: Once | INTRAMUSCULAR | Status: AC
  Administered 2017-08-06: 50 ug via INTRAVENOUS
  Filled 2017-08-06: qty 2

## 2017-08-06 MED ORDER — ONDANSETRON HCL 4 MG/2ML IJ SOLN
4.0000 mg | Freq: Once | INTRAMUSCULAR | Status: AC
  Administered 2017-08-06: 4 mg via INTRAVENOUS
  Filled 2017-08-06: qty 2

## 2017-08-06 MED ORDER — LEVOTHYROXINE SODIUM 75 MCG PO TABS
75.0000 ug | ORAL_TABLET | Freq: Every day | ORAL | Status: DC
Start: 2017-08-06 — End: 2017-08-08
  Administered 2017-08-06 – 2017-08-08 (×3): 75 ug via ORAL
  Filled 2017-08-06 (×3): qty 1

## 2017-08-06 MED ORDER — LORAZEPAM 0.5 MG PO TABS: 0.5000 mg | ORAL_TABLET | Freq: Four times a day (QID) | ORAL | Status: DC | PRN

## 2017-08-06 MED ORDER — CHLORHEXIDINE GLUCONATE 4 % EX LIQD: 60.0000 mL | Freq: Once | CUTANEOUS | Status: DC

## 2017-08-06 MED ORDER — PHENYLEPHRINE 40 MCG/ML (10ML) SYRINGE FOR IV PUSH (FOR BLOOD PRESSURE SUPPORT)
PREFILLED_SYRINGE | INTRAVENOUS | Status: AC
  Filled 2017-08-06: qty 20

## 2017-08-06 MED ORDER — GUAIFENESIN ER 600 MG PO TB12
600.0000 mg | ORAL_TABLET | Freq: Two times a day (BID) | ORAL | Status: DC
  Administered 2017-08-06 – 2017-08-08 (×4): 600 mg via ORAL
  Filled 2017-08-06 (×5): qty 1

## 2017-08-06 MED ORDER — FAMOTIDINE 20 MG PO TABS
20.0000 mg | ORAL_TABLET | Freq: Two times a day (BID) | ORAL | Status: DC
  Administered 2017-08-06 – 2017-08-08 (×4): 20 mg via ORAL
  Filled 2017-08-06 (×4): qty 1

## 2017-08-06 MED ORDER — CEFAZOLIN SODIUM-DEXTROSE 1-4 GM/50ML-% IV SOLN
1.0000 g | Freq: Once | INTRAVENOUS | Status: AC
  Administered 2017-08-06: 1 g via INTRAVENOUS
  Filled 2017-08-06: qty 50

## 2017-08-06 MED ORDER — LIDOCAINE HCL (CARDIAC) PF 100 MG/5ML IV SOSY
PREFILLED_SYRINGE | INTRAVENOUS | Status: DC | PRN
  Administered 2017-08-06: 80 mg via INTRAVENOUS

## 2017-08-06 MED ORDER — POLYETHYLENE GLYCOL 3350 17 G PO PACK: 17.0000 g | PACK | Freq: Every day | ORAL | Status: DC | PRN

## 2017-08-06 MED ORDER — 0.9 % SODIUM CHLORIDE (POUR BTL) OPTIME
TOPICAL | Status: DC | PRN
  Administered 2017-08-06: 1000 mL

## 2017-08-06 MED ORDER — VANCOMYCIN HCL 500 MG IV SOLR
INTRAVENOUS | Status: AC
  Filled 2017-08-06: qty 500

## 2017-08-06 MED ORDER — EPHEDRINE SULFATE 50 MG/ML IJ SOLN
INTRAMUSCULAR | Status: AC
  Filled 2017-08-06: qty 1

## 2017-08-06 MED ORDER — OMEGA-3-ACID ETHYL ESTERS 1 G PO CAPS
1.0000 g | ORAL_CAPSULE | Freq: Every day | ORAL | Status: DC
  Administered 2017-08-07 – 2017-08-08 (×2): 1 g via ORAL
  Filled 2017-08-06 (×2): qty 1

## 2017-08-06 MED ORDER — FENTANYL CITRATE (PF) 100 MCG/2ML IJ SOLN
INTRAMUSCULAR | Status: DC | PRN
  Administered 2017-08-06 (×2): 50 ug via INTRAVENOUS

## 2017-08-06 MED ORDER — PROPOFOL 10 MG/ML IV BOLUS
INTRAVENOUS | Status: AC
  Filled 2017-08-06: qty 40

## 2017-08-06 MED ORDER — PHENYLEPHRINE 40 MCG/ML (10ML) SYRINGE FOR IV PUSH (FOR BLOOD PRESSURE SUPPORT)
PREFILLED_SYRINGE | INTRAVENOUS | Status: DC | PRN
  Administered 2017-08-06: 40 ug via INTRAVENOUS
  Administered 2017-08-06 (×2): 80 ug via INTRAVENOUS
  Administered 2017-08-06 (×5): 40 ug via INTRAVENOUS

## 2017-08-06 MED ORDER — LOPERAMIDE HCL 2 MG PO CAPS: 4.0000 mg | ORAL_CAPSULE | ORAL | Status: DC | PRN

## 2017-08-06 MED ORDER — VANCOMYCIN HCL 1000 MG IV SOLR
INTRAVENOUS | Status: AC
  Filled 2017-08-06: qty 1000

## 2017-08-06 MED ORDER — VANCOMYCIN HCL 1000 MG IV SOLR
INTRAVENOUS | Status: DC | PRN
  Administered 2017-08-06: 500 mg via TOPICAL
  Administered 2017-08-06: 1000 mg via TOPICAL

## 2017-08-06 MED ORDER — ALBUMIN HUMAN 5 % IV SOLN
INTRAVENOUS | Status: DC | PRN
  Administered 2017-08-06: 13:00:00 via INTRAVENOUS

## 2017-08-06 MED ORDER — POVIDONE-IODINE 10 % EX SWAB: 2.0000 "application " | Freq: Once | CUTANEOUS | Status: DC

## 2017-08-06 MED ORDER — LIDOCAINE-EPINEPHRINE (PF) 2 %-1:200000 IJ SOLN
10.0000 mL | Freq: Once | INTRAMUSCULAR | Status: AC
  Administered 2017-08-06: 10 mL
  Filled 2017-08-06: qty 20

## 2017-08-06 MED ORDER — HYDRALAZINE HCL 20 MG/ML IJ SOLN: 5.0000 mg | INTRAMUSCULAR | Status: DC | PRN

## 2017-08-06 MED ORDER — UMECLIDINIUM-VILANTEROL 62.5-25 MCG/INH IN AEPB
1.0000 | INHALATION_SPRAY | Freq: Every day | RESPIRATORY_TRACT | Status: DC
  Administered 2017-08-06 – 2017-08-08 (×3): 1 via RESPIRATORY_TRACT
  Filled 2017-08-06: qty 14

## 2017-08-06 MED ORDER — FENTANYL CITRATE (PF) 100 MCG/2ML IJ SOLN: 12.5000 ug | INTRAMUSCULAR | Status: DC | PRN

## 2017-08-06 MED ORDER — FENTANYL CITRATE (PF) 250 MCG/5ML IJ SOLN
INTRAMUSCULAR | Status: AC
  Filled 2017-08-06: qty 5

## 2017-08-06 MED ORDER — BUPIVACAINE-EPINEPHRINE (PF) 0.5% -1:200000 IJ SOLN
INTRAMUSCULAR | Status: DC | PRN
  Administered 2017-08-06: 30 mL via PERINEURAL

## 2017-08-06 MED ORDER — FENTANYL CITRATE (PF) 100 MCG/2ML IJ SOLN
INTRAMUSCULAR | Status: AC
  Administered 2017-08-06: 50 ug via INTRAVENOUS
  Filled 2017-08-06: qty 2

## 2017-08-06 MED ORDER — ROCURONIUM BROMIDE 100 MG/10ML IV SOLN
INTRAVENOUS | Status: DC | PRN
  Administered 2017-08-06: 20 mg via INTRAVENOUS
  Administered 2017-08-06: 30 mg via INTRAVENOUS

## 2017-08-06 MED ORDER — SUCCINYLCHOLINE CHLORIDE 200 MG/10ML IV SOSY
PREFILLED_SYRINGE | INTRAVENOUS | Status: DC | PRN
  Administered 2017-08-06: 100 mg via INTRAVENOUS

## 2017-08-06 MED ORDER — ZOLPIDEM TARTRATE 5 MG PO TABS: 5.0000 mg | ORAL_TABLET | Freq: Every evening | ORAL | Status: DC | PRN

## 2017-08-06 MED ORDER — IPRATROPIUM-ALBUTEROL 0.5-2.5 (3) MG/3ML IN SOLN: 3.0000 mL | RESPIRATORY_TRACT | Status: DC | PRN

## 2017-08-06 MED ORDER — ONDANSETRON HCL 4 MG/2ML IJ SOLN
INTRAMUSCULAR | Status: AC
  Filled 2017-08-06: qty 2

## 2017-08-06 MED ORDER — INSULIN ASPART 100 UNIT/ML ~~LOC~~ SOLN
0.0000 [IU] | Freq: Every day | SUBCUTANEOUS | Status: DC
  Administered 2017-08-06: 4 [IU] via SUBCUTANEOUS
  Administered 2017-08-07: 2 [IU] via SUBCUTANEOUS

## 2017-08-06 MED ORDER — LACTATED RINGERS IV SOLN
INTRAVENOUS | Status: DC
  Administered 2017-08-06: 16:00:00 via INTRAVENOUS

## 2017-08-06 MED ORDER — FENTANYL CITRATE (PF) 100 MCG/2ML IJ SOLN
INTRAMUSCULAR | Status: AC
  Filled 2017-08-06: qty 2

## 2017-08-06 MED ORDER — DOCUSATE SODIUM 100 MG PO CAPS
100.0000 mg | ORAL_CAPSULE | Freq: Two times a day (BID) | ORAL | Status: DC | PRN
  Administered 2017-08-08: 100 mg via ORAL

## 2017-08-06 SURGICAL SUPPLY — 88 items
BANDAGE ACE 3X5.8 VEL STRL LF (GAUZE/BANDAGES/DRESSINGS) ×4 IMPLANT
BANDAGE ACE 4X5 VEL STRL LF (GAUZE/BANDAGES/DRESSINGS) IMPLANT
BANDAGE ACE 6X5 VEL STRL LF (GAUZE/BANDAGES/DRESSINGS) IMPLANT
BANDAGE ELASTIC 6 VELCRO ST LF (GAUZE/BANDAGES/DRESSINGS) ×4 IMPLANT
BIT DRILL 100X2XQC STRL (BIT) ×2 IMPLANT
BIT DRILL CALIBRATED 1.8MM (BIT) ×2 IMPLANT
BIT DRILL CALIBRATED 4.2 (BIT) ×2 IMPLANT
BIT DRILL QC 2.0X100 (BIT) ×2
BIT DRILL SHORT 4.2 (BIT) ×2 IMPLANT
BIT DRL 100X2XQC STRL (BIT) ×2
BLADE SURG 10 STRL SS (BLADE) ×8 IMPLANT
BNDG COHESIVE 4X5 TAN STRL (GAUZE/BANDAGES/DRESSINGS) ×4 IMPLANT
BNDG COHESIVE 6X5 TAN STRL LF (GAUZE/BANDAGES/DRESSINGS) IMPLANT
BNDG GAUZE ELAST 4 BULKY (GAUZE/BANDAGES/DRESSINGS) ×4 IMPLANT
BRUSH SCRUB SURG 4.25 DISP (MISCELLANEOUS) ×8 IMPLANT
CHLORAPREP W/TINT 26ML (MISCELLANEOUS) ×12 IMPLANT
COVER SURGICAL LIGHT HANDLE (MISCELLANEOUS) ×8 IMPLANT
CUFF TOURNIQUET SINGLE 18IN (TOURNIQUET CUFF) ×4 IMPLANT
DISTAL RADIUS HEAD 6H 3HOLE SH (Orthopedic Implant) ×4 IMPLANT
DRAPE C-ARM 35X43 STRL (DRAPES) ×4 IMPLANT
DRAPE C-ARMOR (DRAPES) ×4 IMPLANT
DRAPE HALF SHEET 40X57 (DRAPES) ×8 IMPLANT
DRAPE IMP U-DRAPE 54X76 (DRAPES) ×8 IMPLANT
DRAPE INCISE IOBAN 66X45 STRL (DRAPES) ×4 IMPLANT
DRAPE ORTHO SPLIT 77X108 STRL (DRAPES) ×4
DRAPE SURG 17X23 STRL (DRAPES) ×4 IMPLANT
DRAPE SURG ORHT 6 SPLT 77X108 (DRAPES) ×4 IMPLANT
DRAPE U-SHAPE 47X51 STRL (DRAPES) ×4 IMPLANT
DRILL BIT CALIBRATED 4.2 (BIT) ×4
DRILL BIT SHORT 4.2 (BIT) ×2
DRILL CALIBRATED 1.8MM (BIT) ×4
DRSG ADAPTIC 3X8 NADH LF (GAUZE/BANDAGES/DRESSINGS) ×4 IMPLANT
DRSG EMULSION OIL 3X3 NADH (GAUZE/BANDAGES/DRESSINGS) ×4 IMPLANT
DRSG MEPILEX BORDER 4X4 (GAUZE/BANDAGES/DRESSINGS) ×4 IMPLANT
DRSG MEPILEX BORDER 4X8 (GAUZE/BANDAGES/DRESSINGS) ×4 IMPLANT
ELECT REM PT RETURN 9FT ADLT (ELECTROSURGICAL) ×4
ELECTRODE REM PT RTRN 9FT ADLT (ELECTROSURGICAL) ×2 IMPLANT
GAUZE SPONGE 4X4 12PLY STRL (GAUZE/BANDAGES/DRESSINGS) ×4 IMPLANT
GAUZE SPONGE 4X4 12PLY STRL LF (GAUZE/BANDAGES/DRESSINGS) ×8 IMPLANT
GLOVE BIO SURGEON STRL SZ7.5 (GLOVE) ×12 IMPLANT
GLOVE BIOGEL PI IND STRL 7.5 (GLOVE) ×8 IMPLANT
GLOVE BIOGEL PI INDICATOR 7.5 (GLOVE) ×8
GOWN STRL REUS W/ TWL LRG LVL3 (GOWN DISPOSABLE) IMPLANT
GOWN STRL REUS W/TWL LRG LVL3 (GOWN DISPOSABLE)
GOWN STRL REUS W/TWL XL LVL3 (GOWN DISPOSABLE) ×20 IMPLANT
GUIDEWIRE 3.2X400 (WIRE) ×12 IMPLANT
HEAD RADIUS DISTAL 3HOLE SH 6H (Orthopedic Implant) ×2 IMPLANT
K-WIRE 1.25 TRCR POINT 150 (WIRE) ×4
KIT BASIN OR (CUSTOM PROCEDURE TRAY) ×4 IMPLANT
KIT TURNOVER KIT B (KITS) ×4 IMPLANT
KWIRE 1.25 TRCR POINT 150 (WIRE) ×2 IMPLANT
MANIFOLD NEPTUNE II (INSTRUMENTS) IMPLANT
NAIL CANN FRN 10X360 LT (Nail) ×4 IMPLANT
NS IRRIG 1000ML POUR BTL (IV SOLUTION) ×4 IMPLANT
PACK GENERAL/GYN (CUSTOM PROCEDURE TRAY) ×4 IMPLANT
PACK ORTHO EXTREMITY (CUSTOM PROCEDURE TRAY) ×4 IMPLANT
PAD ARMBOARD 7.5X6 YLW CONV (MISCELLANEOUS) ×8 IMPLANT
PAD CAST 4YDX4 CTTN HI CHSV (CAST SUPPLIES) ×4 IMPLANT
PADDING CAST COTTON 4X4 STRL (CAST SUPPLIES) ×4
REAMER ROD DEEP FLUTE 2.5X950 (INSTRUMENTS) ×4 IMPLANT
SCREW LOCK T8 20X2.4XSTVA (Screw) ×4 IMPLANT
SCREW LOCKING 2.4X20 (Screw) ×4 IMPLANT
SCREW LOCKING 2.4X22 (Screw) ×4 IMPLANT
SCREW LOCKING 5.0MMX40MM (Screw) ×4 IMPLANT
SCREW LOCKING 5.0X48MM (Screw) ×4 IMPLANT
SCREW RECON 6.5 X 85MM (Screw) ×4 IMPLANT
SCREW RECON W/T25 STRD 90MM (Screw) ×4 IMPLANT
SCREW SELF TAP 12M (Screw) ×12 IMPLANT
SCREW VA LOCKING 2.4X18 (Screw) ×12 IMPLANT
SPLINT PLASTER CAST XFAST 5X30 (CAST SUPPLIES) ×2 IMPLANT
SPLINT PLASTER XFAST SET 5X30 (CAST SUPPLIES) ×2
STAPLER VISISTAT 35W (STAPLE) ×4 IMPLANT
STOCKINETTE IMPERVIOUS LG (DRAPES) ×4 IMPLANT
SUCTION FRAZIER HANDLE 10FR (MISCELLANEOUS) ×2
SUCTION TUBE FRAZIER 10FR DISP (MISCELLANEOUS) ×2 IMPLANT
SUT ETHILON 3 0 PS 1 (SUTURE) ×16 IMPLANT
SUT MNCRL AB 3-0 PS2 18 (SUTURE) ×4 IMPLANT
SUT MON AB 2-0 CT1 36 (SUTURE) ×4 IMPLANT
SUT VIC AB 0 CT1 27 (SUTURE)
SUT VIC AB 0 CT1 27XBRD ANBCTR (SUTURE) IMPLANT
SUT VIC AB 2-0 CT1 27 (SUTURE) ×4
SUT VIC AB 2-0 CT1 TAPERPNT 27 (SUTURE) ×4 IMPLANT
TOWEL OR 17X24 6PK STRL BLUE (TOWEL DISPOSABLE) ×12 IMPLANT
TOWEL OR 17X26 10 PK STRL BLUE (TOWEL DISPOSABLE) ×8 IMPLANT
TUBE CONNECTING 12'X1/4 (SUCTIONS) ×1
TUBE CONNECTING 12X1/4 (SUCTIONS) ×3 IMPLANT
UNDERPAD 30X30 (UNDERPADS AND DIAPERS) ×4 IMPLANT
WATER STERILE IRR 1000ML POUR (IV SOLUTION) IMPLANT

## 2017-08-06 NOTE — Op Note (Signed)
OrthopaedicSurgeryOperativeNote (UXL:[id number](CSN:[id number]) Date of Surgery: 08/05/2017 - 08/06/2017  Admit Date: 08/05/2017   Diagnoses: Pre-Op Diagnoses: Left midshaft femur fracture Left intra-articular distal radius fracture  Post-Op Diagnosis: Same  Procedures: 1. CPT 27506-Intramedullary nailing of left femur 2. CPT 25608-Open reduction internal fixation left distal radius fracture 3. CPT 12002-Closure of skin tear to left wrist size 5 cm  Surgeons: Primary: Alliyah Roesler, Gillie MannersKevin P, MD   Assistant: Montez MoritaKeith Paul, PA-C  Location:[facility] OR ROOM 03   AnesthesiaGeneral   Antibiotics:Ancef 2g preop   Tourniquettime: Total Tourniquet Time Documented: Upper Arm (Left) - 42 minutes Total: Upper Arm (Left) - 42 minutes  EstimatedBloodLoss:200 mL   Complications:Skin tear to left leg while cutting off stockinette that required sutures  Specimens:Same  mplants: Implant Name Type Inv. Item Serial No. Manufacturer Lot No. LRB No. Used Action  NAIL CANN FRN 10X360 LT - [id number] Nail NAIL CANN FRN 10X360 LT  [employer] [id number] Left 1 Implanted  SCREW RECON W/T25 STRD 90MM - [id number] Screw SCREW RECON W/T25 STRD 90MM  [employer]ES [employer] [id number] Left 1 Implanted  SCREW RECON 6.5 X 85MM - [id number] Screw SCREW RECON 6.5 X 85MM  [employer] [id number] Left 1 Implanted  SCREW LOCKING 5.0MMX40MM - [id number] Screw SCREW LOCKING 5.0MMX40MM  [employer]ES [employer]UMA [id number] Left 1 Implanted  SCREW LOCKING 5.0X48MM - [id number] Screw SCREW LOCKING 5.0X48MM  [employer]ES [employer]UMA [id number] Left 1 Implanted  DISTAL RADIUS HEAD 6H 3HOLE SH - [id number] Orthopedic Implant DISTAL RADIUS HEAD 6H 3HOLE SH  [employer]  Left 1 Implanted  SCREW SELF TAP 53M - [id number] Screw SCREW SELF TAP 53M  [employer]  Left 3 Implanted  SCREW VA LOCKING 2.4X18 - [id number] Screw SCREW VA LOCKING 2.4X18  [employer]UMA  Left 3 Implanted  SCREW LOCKING 2.4X20 - [id number] Screw SCREW LOCKING 2.4X20  [employer]UMA   Left 2 Implanted  SCREW LOCKING 2.4X22 - [id number] Screw SCREW LOCKING 2.4X22  [employer]UMA  Left 1 Implanted    IndicationsforSurgery: 77 year old female with a history of past medical history medical history including non-insulin-dependent diabetes, history of heart attack, coronary artery disease, hypothyroidism and liver disorder s/p fall with midshaft left femur fracture and left intra-articular distal radius fracture I reviewed imaging findings with the patient and discuss need for surgical fixation of both her femur and her wrist.  We will plan for antegrade intramedullary nailing of left femur fracture. Risks discussed included bleeding requiring blood transfusion, bleeding causing a hematoma, infection, malunion, nonunion, damage to surrounding nerves and blood vessels, pain, hardware prominence or irritation, hardware failure, stiffness, post-traumatic arthritis, DVT/PE, compartment syndrome, and even death. In regards to the left wrist, I discussed with the patient and the family about potential nonoperative treatment including long-arm casting for 3 weeks and then short arm casting first 3 weeks and then removal wrist splint after that.  I felt that proceeding with surgical fixation would be most appropriate as it would allow for sooner range of motion and allow for platform walking and elbow motion which should be important for her recovery from her femur.  The patient and her family are in agreement with proceeding with both. Consent was obtained.  Operative Findings: 1. Antegrade piriformis recon nail using Synthes FRN 360x2010mm with two recon screws and two distal interlocks 2. Skin tear/laceration about 8cm in length while cutting stockinette off left lower extremity 3. Open reduction internal fixation of left distal radius fracture using Synthes VA volar locking distal radius plate as noted above  4. Repair of ulnar skin tear, measuring 5cm  Procedure: The patient was identified in  the preoperative holding area. Consent was confirmed with the patient and the family and all questions were answered. The operative extremity was marked and the patient was then brought back to the operating room by our anesthesia colleagues. He was carefully transferred to a radiolucent flat-top table. A bump was placed and secured under the operative extremity. The operative extremity was then prepped and draped in sterile fashion. A pre incision timeout was performed to verify the patient, the procedure and the extremity. Preoperative antibiotics were dosed.   Manual traction was applied. The hip and knee were flexed over a triangle. AP and lateral view were obtained to identify a correct incision and the skin and subcutaneous fat were incised above the greater trochanter. A curved mayo scissors was used to spread inline with the hip abductors to the piriformis fossa. A threaded guidepin was used to identify the correct starting point. AP and lateral views were used to advance the pin to just below the lesser trochanter. A entry reamer was used to enter the canal and the guidepin and reamer were removed.  A bent ball-tip guidewire was sent done the canal. A reduction maneuver was performed. The ball-tip guidewire was eventually passed in the distal segment. AP and lateral fluoro was used to make sure the path of the guidewire was center-center in the distal part of the femur. This was seated down into the physeal scar. The length of the nail was measured and we chose a 460 nail. The fracture was held reduced and I sequentially reamed from a 8.27mm to 11.5 mm with adequate chatter at the end. I was careful with reaming across the fracture to prevent any comminution. The nail was then passed down the center of the canal.  The nail was seated until it was flush with the piriformis entry. The targeting device for recon screws was placed and two femoral neck screws were placed to provide proximal fixation. Once  we had the proximal segment locked to the nail we focused on rotation and alignment. The cortices lined up anatomic and the nail was forward slapped to provide compression across the fracture. Using perfect circle technique, two distal interlocks were placed in the nail.  The insertion handle was removed an final x-rays were obtained. The incisions were then irrigated and closed with 2-0 vicryl and 3-0 nylon. This incisions were sThe drapes were broken down and an ACE wrap was placed over the RLE after the incisions were dressed with mepilex. During removal of the stockinette a laceration was caused to the medial lower leg. Due to her thin skin the laceration was about 8 cm in length. It was subcutaneous in nature and it was irrigated and closed with interrupted nylon sutures.   An arm board was then placed to the bed and the splint was cut down. A nonsterile tourniquet was placed to the upper arm.The operative extremity was then prepped and draped in usual sterile fashion. A preoperative timeout was performed to verify the patient, the procedure, and the extremity.  Fluoroscopy images were used to visualize the fracture.  An Esmarch was used to exsanguinate the extremity and then the tourniquet was inflated to 250 mmHg.  A standard FCR approach was performed.  The skin and subcutaneous tissue.  The fascia overlying the FCR was incised.  The FCR tendon was moved out of the way in the dorsal sheath was incised as well.  The finger flexors were swept out of the way and the pronator quadratus was visualized.  This was taken down from the radial border using a 15 blade and periosteal elevator.  A reduction maneuver was performed to improve the alignment of the distal articular segment.  Percutaneous K wires were placed in the radial styloid and directed proximal and ulnar to gain fixation into the intact shaft. Another K-wire was used to provisionally hold the articular surface  When the distal radius was  provisionally fixed with a K wire was a volar plate was appropriately positioned in the AP and lateral fluoroscopic imaging.  It was pinned in place.  I then proceeded to place the distal fixation with locking screws.  I used fluoroscopy to confirm that they were extra-articular.  A total of 5 locking screws were placed into the distal segment.  Nonlocking screws were placed in the shaft and brought the plate flush to bone.  This completed the fixation construct.  Final fluoroscopic images were obtained.  The incision was thoroughly irrigated.  A gram of vancomycin powder was placed into the wound.  The quadratus was left unrepaired.  The skin was then closed with 2-0 Vicryl and 3-0 nylon suture. The skin tear was closed with 3-0 nylon. A sterile dressing consisting of Adaptic, 4 x 4's, sterile cast padding volar wrist splint was placed.  Patient was then awoken from anesthesia and taken to the PACU in stable condition.  Post Op Plan/Instructions: Patient will be weightbearing as tolerated to the left lower extremity and weight bearing as tolerated thru elbow, nonweight bearing through the wrist. Ancef for postoperative prophylaxis. Lovenox for DVT prophylaxis. PT and OT for evaluation.  I was present and performed the entire surgery.  Montez Morita, PA-C did assist me throughout the case. An assistant was necessary given the difficulty in approach, maintenance of reduction and ability to instrument the fracture.  Truitt Merle, MD [facility]

## 2017-08-06 NOTE — ED Notes (Signed)
Ortho tech at bedside 

## 2017-08-06 NOTE — Progress Notes (Signed)
Orthopedic Tech Progress Note Patient Details:  Crystal Pham Jul 01, 1940 [id number]  Ortho Devices Type of Ortho Device: Sugartong splint, Arm sling Ortho Device/Splint Location: lue Ortho Device/Splint Interventions: Ordered, Application   Post Interventions Patient Tolerated: Well Instructions Provided: Care of device, Adjustment of device   Trinna PostMartinez, [name] J 08/06/2017, 2:33 AM

## 2017-08-06 NOTE — H&P (Signed)
History and Physical    Crystal Pham:[id number] DOB: [birth year] DOA: 08/05/2017  Referring MD/NP/PA:   PCP: No primary care provider on file.   Patient coming from:  The patient is coming from home.  At baseline, pt is independent for most of ADL.    Chief Complaint: fall and left thigh and wrist pain  HPI: Crystal Pham is a 77 y.o. female with medical history significant of hypertension, diabetes mellitus, GERD, CAD, hypothyroidism, who presents with fall, left thigh and wrist pain.  Per her daughter, pt fell when she lost her balance on stairs, landed on her left side at about 9:00 PM.  She developed severe pain in the left thigh and left wrist.  She has skin laceration in the left wrist area.  Also has a small skin abrasion in the nose bridge. No LOC.  The pain is constant, severe, sharp, nonradiating, aggravated by movement.  Patient states that she does not have chest pain, shortness of breath, cough, fever or chills.  No nausea, vomiting, diarrhea, abdominal pain, symptoms of UTI.  No numbness in left leg and left arm. Pt has a hx of ritght hip fx back in January.   ED Course: pt was found to have WBC 12.6, INR 0.96, electrolytes renal function okay, temperature normal, no tachycardia, no tachypnea, oxygen saturation 93% on room air.  Chest x-ray showed COPD, no other acute issues.  X-ray showed displaced left distal radius fracture and ulnar styloid fracture, also showed displaced left mid femur diaphysis fracture. EDP consulted hand surgeon, Dr. Mina Marble and ortho, Dr. Jena Gauss. Pt is admitted to MedSurg bed as inpatient.   Review of Systems:   General: no fevers, chills, no body weight gain, has fatigue HEENT: no blurry vision, hearing changes or sore throat Respiratory: no dyspnea, coughing, wheezing CV: no chest pain, no palpitations GI: no nausea, vomiting, abdominal pain, diarrhea, constipation GU: no dysuria, burning on urination, increased urinary frequency, hematuria    Ext: no leg edema Neuro: no unilateral weakness, numbness, or tingling, no vision change or hearing loss. Had fall. Skin: no rash. Has skin laceration in the left wrist MSK: has pain in left thigh and left wrist. Heme: No easy bruising.  Travel history: No recent long distant travel.  Allergy:  Allergies  Allergen Reactions  . Adhesive [Tape] Other (See Comments)    PAPER TAPE IS PREFERRED, PLEASE  . Oxycodone Other (See Comments)    Hallucinations, but tolerated morphine 02/28/2017   . Promethazine Nausea And Vomiting  . Levofloxacin Other (See Comments)    Tremors  . Motrin [Ibuprofen] Other (See Comments)    Patient was told to not take this    Past Medical History:  Diagnosis Date  . Arthritis   . Diabetes mellitus without complication (HCC)    Type II  . Heart attack (HCC)   . Hypertension   . Hypothyroidism   . Liver disorder   . Thyroid disease     Past Surgical History:  Procedure Laterality Date  . ABDOMINAL HYSTERECTOMY    . APPENDECTOMY    . JOINT REPLACEMENT      Social History:  reports that she has quit smoking. She has never used smokeless tobacco. She reports that she does not drink alcohol or use drugs.  Family History:  Family History  Problem Relation Age of Onset  . Heart disease Mother   . Heart disease Father   . Heart disease Sister   . Heart disease Brother  Prior to Admission medications   Not on File    Physical Exam: Vitals:   08/06/17 0345 08/06/17 0400 08/06/17 0415 08/06/17 0430  BP: (!) 158/65 (!) 154/54 (!) 155/67 (!) 151/73  Pulse: 77 78 79 77  Resp: 18 18 15 19   Temp:      TempSrc:      SpO2: 100% 99% 100% 100%  Weight:      Height:       General: Not in acute distress HEENT:       Eyes: PERRL, EOMI, no scleral icterus.       ENT: No discharge from the ears and nose, no pharynx injection, no tonsillar enlargement.        Neck: No JVD, no bruit, no mass felt. Heme: No neck lymph node enlargement. Cardiac:  S1/S2, RRR, No murmurs, No gallops or rubs. Respiratory: No rales, wheezing, rhonchi or rubs. GI: Soft, nondistended, nontender, no rebound pain, no organomegaly, BS present. GU: No hematuria Ext: No pitting leg edema bilaterally. 2+DP/PT pulse bilaterally. Musculoskeletal: has tenderness in left thigh and left wrist. Buck's traction was applied to left leg. And slpint was applied to left wrist. Skin: has skin laceration in the left wrist area, which is sutured wrapped up by ED physician  neuro: Alert, oriented X3, cranial nerves II-XII grossly intact, moves all extremities. Psych: Patient is not psychotic, no suicidal or hemocidal ideation.  Labs on Admission: I have personally reviewed following labs and imaging studies  CBC: Recent Labs  Lab 08/05/17 2318 08/06/17 0430  WBC 12.6* 15.5*  NEUTROABS 8.9*  --   HGB 10.9* 10.9*  HCT 35.1* 34.7*  MCV 101.4* 100.9*  PLT 214 193   Basic Metabolic Panel: Recent Labs  Lab 08/05/17 2318  NA 139  K 4.1  CL 101  CO2 27  GLUCOSE 153*  BUN 13  CREATININE 0.96  CALCIUM 9.2   GFR: Estimated Creatinine Clearance: 41.9 mL/min (by C-G formula based on SCr of 0.96 mg/dL). Liver Function Tests: No results for input(s): AST, ALT, ALKPHOS, BILITOT, PROT, ALBUMIN in the last 168 hours. No results for input(s): LIPASE, AMYLASE in the last 168 hours. No results for input(s): AMMONIA in the last 168 hours. Coagulation Profile: Recent Labs  Lab 08/05/17 2318  INR 0.96   Cardiac Enzymes: No results for input(s): CKTOTAL, CKMB, CKMBINDEX, TROPONINI in the last 168 hours. BNP (last 3 results) No results for input(s): PROBNP in the last 8760 hours. HbA1C: No results for input(s): HGBA1C in the last 72 hours. CBG: No results for input(s): GLUCAP in the last 168 hours. Lipid Profile: No results for input(s): CHOL, HDL, LDLCALC, TRIG, CHOLHDL, LDLDIRECT in the last 72 hours. Thyroid Function Tests: No results for input(s): TSH, T4TOTAL,  FREET4, T3FREE, THYROIDAB in the last 72 hours. Anemia Panel: No results for input(s): VITAMINB12, FOLATE, FERRITIN, TIBC, IRON, RETICCTPCT in the last 72 hours. Urine analysis: No results found for: COLORURINE, APPEARANCEUR, LABSPEC, PHURINE, GLUCOSEU, HGBUR, BILIRUBINUR, KETONESUR, PROTEINUR, UROBILINOGEN, NITRITE, LEUKOCYTESUR Sepsis Labs: @LABRCNTIP (procalcitonin:4,lacticidven:4) )No results found for this or any previous visit (from the past 240 hour(s)).   Radiological Exams on Admission: Dg Chest 1 View  Result Date: 08/06/2017 CLINICAL DATA:  Patient lost her balance and fell this evening. Pain. EXAM: CHEST  1 VIEW COMPARISON:  12/26/2016 FINDINGS: Borderline cardiomegaly as before. Moderate aortic atherosclerosis without aneurysm. Chronic interstitial prominence and pulmonary hyperinflation consistent with COPD. No pneumothorax or pulmonary consolidations. No pleural effusion. No acute osseous appearing abnormality. IMPRESSION: Chronic  interstitial prominence and pulmonary hyperinflation consistent with COPD. Moderate aortic atherosclerosis. No acute osseous appearing abnormality. Electronically Signed   By: Tollie Eth M.D.   On: 08/06/2017 01:12   Dg Wrist Complete Left  Result Date: 08/06/2017 CLINICAL DATA:  77 y/o  F; fall with wrist pain. EXAM: LEFT WRIST - COMPLETE 3+ VIEW COMPARISON:  None. FINDINGS: Acute comminuted intra-articular impacted fracture of the distal radius with mild dorsal apex angulation. Acute mildly displaced fracture of the ulnar styloid. Normal radiocarpal articulation. No additional fracture or dislocation identified. Bones are demineralized. Vascular calcifications noted. IMPRESSION: 1. Acute comminuted intra-articular impacted fracture of distal radius with mild dorsal apex angulation. 2. Acute mildly displaced fracture of the ulnar styloid. Electronically Signed   By: Mitzi Hansen M.D.   On: 08/06/2017 01:07   Dg Tibia/fibula Left  Result Date:  08/06/2017 CLINICAL DATA:  77 y/o  F; fall with left hip pain. EXAM: DG HIP (WITH OR WITHOUT PELVIS) 2-3V LEFT; LEFT FEMUR 2 VIEWS; LEFT TIBIA AND FIBULA - 2 VIEW COMPARISON:  None. FINDINGS: Left hip: Partially visualized right femur intramedullary nail and proximal lag screw, no periprosthetic lucency or fracture identified. Hip joints are well maintained. No pelvic fracture or diastasis. Degenerative changes of the lower lumbar spine. Oblique overriding angulated and 1 shaft's with displaced fracture of the mid left femur diaphysis. Left femur: Acute oblique fracture of the mid left femur diaphysis with 5 cm overriding, mild dorsal and medial apex angulation, and 1 shaft's width posterior displacement of the distal fracture component. Knee joint is grossly maintained. Left tibia and fibula: No acute fracture or dislocation of the tibia and fibula. Partially visualized knee and ankle joints are grossly maintained. IMPRESSION: 1. Acute oblique fracture of the mid left femur diaphysis with 5 cm overriding, mild dorsal and medial apex angulation, and 1 shaft's width posterior displacement of the distal fracture component. 2. No additional fracture or dislocation identified. Electronically Signed   By: Mitzi Hansen M.D.   On: 08/06/2017 01:05   Dg Hip Unilat With Pelvis 2-3 Views Left  Result Date: 08/06/2017 CLINICAL DATA:  77 y/o  F; fall with left hip pain. EXAM: DG HIP (WITH OR WITHOUT PELVIS) 2-3V LEFT; LEFT FEMUR 2 VIEWS; LEFT TIBIA AND FIBULA - 2 VIEW COMPARISON:  None. FINDINGS: Left hip: Partially visualized right femur intramedullary nail and proximal lag screw, no periprosthetic lucency or fracture identified. Hip joints are well maintained. No pelvic fracture or diastasis. Degenerative changes of the lower lumbar spine. Oblique overriding angulated and 1 shaft's with displaced fracture of the mid left femur diaphysis. Left femur: Acute oblique fracture of the mid left femur diaphysis with 5  cm overriding, mild dorsal and medial apex angulation, and 1 shaft's width posterior displacement of the distal fracture component. Knee joint is grossly maintained. Left tibia and fibula: No acute fracture or dislocation of the tibia and fibula. Partially visualized knee and ankle joints are grossly maintained. IMPRESSION: 1. Acute oblique fracture of the mid left femur diaphysis with 5 cm overriding, mild dorsal and medial apex angulation, and 1 shaft's width posterior displacement of the distal fracture component. 2. No additional fracture or dislocation identified. Electronically Signed   By: Mitzi Hansen M.D.   On: 08/06/2017 01:05   Dg Femur Min 2 Views Left  Result Date: 08/06/2017 CLINICAL DATA:  77 y/o  F; fall with left hip pain. EXAM: DG HIP (WITH OR WITHOUT PELVIS) 2-3V LEFT; LEFT FEMUR 2 VIEWS; LEFT TIBIA AND  FIBULA - 2 VIEW COMPARISON:  None. FINDINGS: Left hip: Partially visualized right femur intramedullary nail and proximal lag screw, no periprosthetic lucency or fracture identified. Hip joints are well maintained. No pelvic fracture or diastasis. Degenerative changes of the lower lumbar spine. Oblique overriding angulated and 1 shaft's with displaced fracture of the mid left femur diaphysis. Left femur: Acute oblique fracture of the mid left femur diaphysis with 5 cm overriding, mild dorsal and medial apex angulation, and 1 shaft's width posterior displacement of the distal fracture component. Knee joint is grossly maintained. Left tibia and fibula: No acute fracture or dislocation of the tibia and fibula. Partially visualized knee and ankle joints are grossly maintained. IMPRESSION: 1. Acute oblique fracture of the mid left femur diaphysis with 5 cm overriding, mild dorsal and medial apex angulation, and 1 shaft's width posterior displacement of the distal fracture component. 2. No additional fracture or dislocation identified. Electronically Signed   By: Mitzi HansenLance  Furusawa-Stratton  M.D.   On: 08/06/2017 01:05     EKG: Independently reviewed. Sinus rhythm, QTC 455, mild ST depression in inferior leads and V3-V6.   Assessment/Plan Principal Problem:   Fall Active Problems:   Closed femur fracture (HCC)   Laceration of left wrist   Closed fracture of left distal radius   Hypertension   Hypothyroidism   CAD (coronary artery disease)   GERD (gastroesophageal reflux disease)   Fall and closed fracture of left distal radius and closed left femur diaphysis fracture: EDP consulted hand surgeon, Dr. Mina MarbleWeingold. "He requested to speak with Dr. Jena GaussHaddix concerning potentially having this managed at the same time as her left femur fracture later today". Dr. Jena GaussHaddix was consulted.  Patient is still have severe pain. Patient is allergic to oxycodone.   - will admit to Med-surg bed - Pain control: PRN tramadol, Tylenol and fentanyl - When necessary Zofran for nausea - Robaxin for muscle spasm - type and cross - INR/PTT - PT/OT when able to (not ordered now) -will get Ct-head  Leukocytosis: WBC 12.6. Likely due to stress-induced demargination. Patient does not have signs of infection. -Follow-up CBC  Laceration of left wrist: suture up by EDP. No active bleeding -Wound care consult  HTN:  -Continue home medications: Metoprolol, losartan  -IV hydralazine prn    Hypothyroidism: -Continue home Synthroid  CAD (coronary artery disease): no CP. -hold ASA due to need of surgery  GERD: -Pepcid   DVT ppx: SCd Code Status: Full code Family Communication:  Yes, patient's daughter and the husband at bed side Disposition Plan:  Anticipate discharge back to previous home environment Consults called:   Admission status: Obs / tele  Inpatient/tele   medical floor/obs     SDU/inpation       Date of Service 08/06/2017    Lorretta HarpXilin Jaziah Goeller [facility] Pager [phone]  If 7PM-7AM, please contact night-coverage [email or web address] Password [email or web address] 08/06/2017, 4:56 AM

## 2017-08-06 NOTE — Anesthesia Postprocedure Evaluation (Signed)
Anesthesia Post Note  Patient: Crystal Pham  Procedure(s) Performed: INTRAMEDULLARY (IM) NAIL FEMORAL (Left ) OPEN REDUCTION INTERNAL FIXATION (ORIF) DISTAL RADIAL FRACTURE     Patient location during evaluation: PACU Anesthesia Type: General Level of consciousness: awake and alert Pain management: pain level controlled Vital Signs Assessment: post-procedure vital signs reviewed and stable Respiratory status: spontaneous breathing, nonlabored ventilation and respiratory function stable Cardiovascular status: blood pressure returned to baseline and stable Postop Assessment: no apparent nausea or vomiting Anesthetic complications: no    Last Vitals:  Vitals:   08/06/17 1700 08/06/17 1955  BP: (!) 122/50 (!) 128/51  Pulse: 75 80  Resp:  15  Temp:  36.9 C  SpO2: 99% 98%    Last Pain:  Vitals:   08/06/17 2000  TempSrc:   PainSc: 0-No pain                 Cecile HearingStephen Edward Allye Hoyos

## 2017-08-06 NOTE — Anesthesia Procedure Notes (Signed)
Procedure Name: Intubation Date/Time: 08/06/2017 11:45 AM Performed by: Fransisca KaufmannMeyer, Eleftherios Dudenhoeffer E, CRNA Pre-anesthesia Checklist: Patient identified, Emergency Drugs available, Suction available, Patient being monitored and Timeout performed Patient Re-evaluated:Patient Re-evaluated prior to induction Oxygen Delivery Method: Circle System Utilized Preoxygenation: Pre-oxygenation with 100% oxygen Induction Type: IV induction Ventilation: Mask ventilation without difficulty Laryngoscope Size: Miller and 2 Grade View: Grade I Tube type: Oral Tube size: 7.5 mm Number of attempts: 1 Airway Equipment and Method: Stylet and Oral airway Placement Confirmation: ETT inserted through vocal cords under direct vision,  positive ETCO2 and breath sounds checked- equal and bilateral Secured at: 22 cm Tube secured with: Tape Dental Injury: Teeth and Oropharynx as per pre-operative assessment  Comments: Lips very dry, friable, ointment applied/soft airway(gauze) used. Teeth/crowns intact. Oropharynx with thick green dried secretions visible on DL

## 2017-08-06 NOTE — Anesthesia Procedure Notes (Signed)
Anesthesia Regional Block: Supraclavicular block   Pre-Anesthetic Checklist: ,, timeout performed, Correct Patient, Correct Site, Correct Laterality, Correct Procedure, Correct Position, site marked, Risks and benefits discussed,  Surgical consent,  Pre-op evaluation,  At surgeon's request and post-op pain management  Laterality: Left  Prep: chloraprep       Needles:  Injection technique: Single-shot  Needle Type: Echogenic Needle     Needle Length: 9cm  Needle Gauge: 21     Additional Needles:   Procedures:,,,, ultrasound used (permanent image in chart),,,,  Narrative:  Start time: 08/06/2017 11:20 AM End time: 08/06/2017 11:25 AM Injection made incrementally with aspirations every 5 mL.  Performed by: Personally  Anesthesiologist: Cecile Hearingurk, Kimber Esterly Edward, MD  Additional Notes: No pain on injection. No increased resistance to injection. Injection made in 5cc increments.  Good needle visualization.  Patient tolerated procedure well.

## 2017-08-06 NOTE — ED Notes (Signed)
Attempted report had to wait for them to answer the phone; approximately 5 minutes. Staff finally answered the phone and stated they could not take report; never gave a reason.

## 2017-08-06 NOTE — Transfer of Care (Signed)
Immediate Anesthesia Transfer of Care Note  Patient: Crystal Pham  Procedure(s) Performed: INTRAMEDULLARY (IM) NAIL FEMORAL (Left ) OPEN REDUCTION INTERNAL FIXATION (ORIF) DISTAL RADIAL FRACTURE  Patient Location: PACU  Anesthesia Type:General and Regional  Level of Consciousness: awake, alert , oriented and sedated  Airway & Oxygen Therapy: Patient Spontanous Breathing and Patient connected to nasal cannula oxygen  Post-op Assessment: Report given to RN, Post -op Vital signs reviewed and stable and Patient moving all extremities  Post vital signs: Reviewed and stable  Last Vitals:  Vitals Value Taken Time  BP 129/84 08/06/2017  2:46 PM  Temp 36.3 C 08/06/2017  2:45 PM  Pulse 94 08/06/2017  2:49 PM  Resp 30 08/06/2017  2:49 PM  SpO2 94 % 08/06/2017  2:49 PM  Vitals shown include unvalidated device data.  Last Pain:  Vitals:   08/06/17 1445  TempSrc:   PainSc: (P) 0-No pain         Complications: No apparent anesthesia complications

## 2017-08-06 NOTE — Progress Notes (Signed)
PROGRESS NOTE  Crystal Pham NWG:[id number] DOB: 31-Oct-1940 DOA: 08/05/2017 PCP: No primary care provider on file.  HPI/Recap of past 5624 hours: 77 year old female with past medical history of hypertension, CAD, hypothyroidism and diabetes mellitus presented to the emergency room on the night of 6/26 after she lost her balance on her stairs landing on her left side with severe pain in her left thigh and left wrist.  Brought into the emergency room and found to have a displaced left distal radius fracture, ulnar styloid fracture and displaced left mid femur diaphysis fracture.  Assessment/Plan: Principal Problem: Fall status post closed femur fracture (HCC) and closed fracture of the distal radius, seen by orthopedic surgery plan to take patient to the operating room later today. Diabetes mellitus: Monitor CBGs, currently n.p.o. so sliding scale only.   Hypertension: Mildly elevated blood pressures, to be expected in the setting of pain   Hypothyroidism: Stable, continue Synthroid   CAD (coronary artery disease): Stable   GERD (gastroesophageal reflux disease) Overweight: Patient meets criteria with BMI greater than 25 Leukocytosis: May be stress margination, although actually slightly increased today.  Chest x-ray unremarkable although does show signs of chronic COPD.  Check urinalysis.  Code Status: Full code  Family Communication: Son and husband at the bedside  Disposition Plan: Surgery today, disposition to be determined in the next 24 hours following surgery   Consultants:  [place]: Orthopedics  Procedures:  Planned surgical fixation of left femur and radius  Antimicrobials:  Preop Ancef  DVT prophylaxis: Subcutaneous heparin   Objective: Vitals:   08/06/17 0415 08/06/17 0430 08/06/17 0512 08/06/17 0813  BP: (!) 155/67 (!) 151/73 (!) 143/63   Pulse: 79 77 83   Resp: 15 19 20    Temp:   98.4 F (36.9 C)   TempSrc:   Oral   SpO2: 100% 100% 94% 98%  Weight:        Height:       No intake or output data in the 24 hours ending 08/06/17 0914 Filed Weights   08/05/17 2300  Weight: 68.9 kg (152 lb)   Body mass index is 29.69 kg/m.  Exam:   General:   Alert and oriented, x3, no acute distress  Cardiovascular: Regular rate and rhythm, S1-S2  Respiratory: Clear to auscultation bilaterally  Abdomen: Soft, nontender, nondistended, positive bowel sounds  Musculoskeletal: No clubbing or cyanosis or edema.  Left arm currently wrapped  Skin: No skin breaks, tears or lesions other left arm which is currently wrapped  Psychiatry: Appropriate, no evidence of psychoses  Neuro: No focal deficits   Data Reviewed: CBC: Recent Labs  Lab 08/05/17 2318 08/06/17 0430  WBC 12.6* 15.5*  NEUTROABS 8.9*  --   HGB 10.9* 10.9*  HCT 35.1* 34.7*  MCV 101.4* 100.9*  PLT 214 193   Basic Metabolic Panel: Recent Labs  Lab 08/05/17 2318 08/06/17 0430  NA 139 137  K 4.1 4.0  CL 101 104  CO2 27 24  GLUCOSE 153* 188*  BUN 13 14  CREATININE 0.96 0.94  CALCIUM 9.2 8.5*   GFR: Estimated Creatinine Clearance: 42.7 mL/min (by C-G formula based on SCr of 0.94 mg/dL). Liver Function Tests: No results for input(s): AST, ALT, ALKPHOS, BILITOT, PROT, ALBUMIN in the last 168 hours. No results for input(s): LIPASE, AMYLASE in the last 168 hours. No results for input(s): AMMONIA in the last 168 hours. Coagulation Profile: Recent Labs  Lab 08/05/17 2318  INR 0.96   Cardiac Enzymes: No results for  input(s): CKTOTAL, CKMB, CKMBINDEX, TROPONINI in the last 168 hours. BNP (last 3 results) No results for input(s): PROBNP in the last 8760 hours. HbA1C: No results for input(s): HGBA1C in the last 72 hours. CBG: Recent Labs  Lab 08/06/17 0709  GLUCAP 155*   Lipid Profile: No results for input(s): CHOL, HDL, LDLCALC, TRIG, CHOLHDL, LDLDIRECT in the last 72 hours. Thyroid Function Tests: No results for input(s): TSH, T4TOTAL, FREET4, T3FREE, THYROIDAB in  the last 72 hours. Anemia Panel: No results for input(s): VITAMINB12, FOLATE, FERRITIN, TIBC, IRON, RETICCTPCT in the last 72 hours. Urine analysis: No results found for: COLORURINE, APPEARANCEUR, LABSPEC, PHURINE, GLUCOSEU, HGBUR, BILIRUBINUR, KETONESUR, PROTEINUR, UROBILINOGEN, NITRITE, LEUKOCYTESUR Sepsis Labs: @LABRCNTIP (procalcitonin:4,lacticidven:4)  )No results found for this or any previous visit (from the past 240 hour(s)).    Studies: Dg Chest 1 View  Result Date: 08/06/2017 CLINICAL DATA:  Patient lost her balance and fell this evening. Pain. EXAM: CHEST  1 VIEW COMPARISON:  12/26/2016 FINDINGS: Borderline cardiomegaly as before. Moderate aortic atherosclerosis without aneurysm. Chronic interstitial prominence and pulmonary hyperinflation consistent with COPD. No pneumothorax or pulmonary consolidations. No pleural effusion. No acute osseous appearing abnormality. IMPRESSION: Chronic interstitial prominence and pulmonary hyperinflation consistent with COPD. Moderate aortic atherosclerosis. No acute osseous appearing abnormality. Electronically Signed   By: Tollie Eth M.D.   On: 08/06/2017 01:12   Dg Wrist Complete Left  Result Date: 08/06/2017 CLINICAL DATA:  77 y/o  F; fall with wrist pain. EXAM: LEFT WRIST - COMPLETE 3+ VIEW COMPARISON:  None. FINDINGS: Acute comminuted intra-articular impacted fracture of the distal radius with mild dorsal apex angulation. Acute mildly displaced fracture of the ulnar styloid. Normal radiocarpal articulation. No additional fracture or dislocation identified. Bones are demineralized. Vascular calcifications noted. IMPRESSION: 1. Acute comminuted intra-articular impacted fracture of distal radius with mild dorsal apex angulation. 2. Acute mildly displaced fracture of the ulnar styloid. Electronically Signed   By: Mitzi Hansen M.D.   On: 08/06/2017 01:07   Dg Tibia/fibula Left  Result Date: 08/06/2017 CLINICAL DATA:  77 y/o  F; fall with  left hip pain. EXAM: DG HIP (WITH OR WITHOUT PELVIS) 2-3V LEFT; LEFT FEMUR 2 VIEWS; LEFT TIBIA AND FIBULA - 2 VIEW COMPARISON:  None. FINDINGS: Left hip: Partially visualized right femur intramedullary nail and proximal lag screw, no periprosthetic lucency or fracture identified. Hip joints are well maintained. No pelvic fracture or diastasis. Degenerative changes of the lower lumbar spine. Oblique overriding angulated and 1 shaft's with displaced fracture of the mid left femur diaphysis. Left femur: Acute oblique fracture of the mid left femur diaphysis with 5 cm overriding, mild dorsal and medial apex angulation, and 1 shaft's width posterior displacement of the distal fracture component. Knee joint is grossly maintained. Left tibia and fibula: No acute fracture or dislocation of the tibia and fibula. Partially visualized knee and ankle joints are grossly maintained. IMPRESSION: 1. Acute oblique fracture of the mid left femur diaphysis with 5 cm overriding, mild dorsal and medial apex angulation, and 1 shaft's width posterior displacement of the distal fracture component. 2. No additional fracture or dislocation identified. Electronically Signed   By: Mitzi Hansen M.D.   On: 08/06/2017 01:05   Dg Hip Unilat With Pelvis 2-3 Views Left  Result Date: 08/06/2017 CLINICAL DATA:  77 y/o  F; fall with left hip pain. EXAM: DG HIP (WITH OR WITHOUT PELVIS) 2-3V LEFT; LEFT FEMUR 2 VIEWS; LEFT TIBIA AND FIBULA - 2 VIEW COMPARISON:  None. FINDINGS: Left hip: Partially  visualized right femur intramedullary nail and proximal lag screw, no periprosthetic lucency or fracture identified. Hip joints are well maintained. No pelvic fracture or diastasis. Degenerative changes of the lower lumbar spine. Oblique overriding angulated and 1 shaft's with displaced fracture of the mid left femur diaphysis. Left femur: Acute oblique fracture of the mid left femur diaphysis with 5 cm overriding, mild dorsal and medial apex  angulation, and 1 shaft's width posterior displacement of the distal fracture component. Knee joint is grossly maintained. Left tibia and fibula: No acute fracture or dislocation of the tibia and fibula. Partially visualized knee and ankle joints are grossly maintained. IMPRESSION: 1. Acute oblique fracture of the mid left femur diaphysis with 5 cm overriding, mild dorsal and medial apex angulation, and 1 shaft's width posterior displacement of the distal fracture component. 2. No additional fracture or dislocation identified. Electronically Signed   By: Mitzi Hansen M.D.   On: 08/06/2017 01:05   Dg Femur Min 2 Views Left  Result Date: 08/06/2017 CLINICAL DATA:  77 y/o  F; fall with left hip pain. EXAM: DG HIP (WITH OR WITHOUT PELVIS) 2-3V LEFT; LEFT FEMUR 2 VIEWS; LEFT TIBIA AND FIBULA - 2 VIEW COMPARISON:  None. FINDINGS: Left hip: Partially visualized right femur intramedullary nail and proximal lag screw, no periprosthetic lucency or fracture identified. Hip joints are well maintained. No pelvic fracture or diastasis. Degenerative changes of the lower lumbar spine. Oblique overriding angulated and 1 shaft's with displaced fracture of the mid left femur diaphysis. Left femur: Acute oblique fracture of the mid left femur diaphysis with 5 cm overriding, mild dorsal and medial apex angulation, and 1 shaft's width posterior displacement of the distal fracture component. Knee joint is grossly maintained. Left tibia and fibula: No acute fracture or dislocation of the tibia and fibula. Partially visualized knee and ankle joints are grossly maintained. IMPRESSION: 1. Acute oblique fracture of the mid left femur diaphysis with 5 cm overriding, mild dorsal and medial apex angulation, and 1 shaft's width posterior displacement of the distal fracture component. 2. No additional fracture or dislocation identified. Electronically Signed   By: Mitzi Hansen M.D.   On: 08/06/2017 01:05    Scheduled  Meds: . calcium-vitamin D  1 tablet Oral QPM  . cholecalciferol  5,000 Units Oral QPM  . famotidine  20 mg Oral BID  . [START ON 08/07/2017] ferrous sulfate  325 mg Oral Q M,W,F  . guaiFENesin  600 mg Oral BID  . insulin aspart  0-5 Units Subcutaneous QHS  . insulin aspart  0-9 Units Subcutaneous TID WC  . levothyroxine  75 mcg Oral QAC breakfast  . losartan  50 mg Oral Daily  . metoprolol tartrate  25 mg Oral BID  . omega-3 acid ethyl esters  1 g Oral Daily  . umeclidinium-vilanterol  1 puff Inhalation Daily    Continuous Infusions:   LOS: 0 days     Hollice Espy, MD [facility]  To reach me or the doctor on call, go to: [email or web address] Password [facility]  08/06/2017, 9:14 AM

## 2017-08-06 NOTE — Anesthesia Preprocedure Evaluation (Addendum)
Anesthesia Evaluation  Patient identified by MRN, date of birth, ID band Patient awake    Reviewed: Allergy & Precautions, NPO status , Patient's Chart, lab work & pertinent test results, reviewed documented beta blocker date and time   Airway Mallampati: II  TM Distance: >3 FB Neck ROM: Full    Dental  (+) Teeth Intact, Dental Advisory Given   Pulmonary COPD,  COPD inhaler and oxygen dependent, former smoker,    Pulmonary exam normal breath sounds clear to auscultation       Cardiovascular hypertension, Pt. on home beta blockers and Pt. on medications (-) angina+ CAD, + Past MI and + Cardiac Stents  Normal cardiovascular exam Rhythm:Regular Rate:Normal     Neuro/Psych negative neurological ROS  negative psych ROS   GI/Hepatic Neg liver ROS, GERD  Medicated,  Endo/Other  diabetes, Type 2, Oral Hypoglycemic AgentsHypothyroidism   Renal/GU negative Renal ROS     Musculoskeletal  (+) Arthritis ,   Abdominal   Peds  Hematology  (+) Blood dyscrasia, anemia ,   Anesthesia Other Findings Day of surgery medications reviewed with the patient.  Reproductive/Obstetrics                            Anesthesia Physical Anesthesia Plan  ASA: III  Anesthesia Plan: General   Post-op Pain Management:  Regional for Post-op pain   Induction: Intravenous  PONV Risk Score and Plan: 3 and Dexamethasone and Ondansetron  Airway Management Planned: Oral ETT  Additional Equipment:   Intra-op Plan:   Post-operative Plan: Extubation in OR  Informed Consent: I have reviewed the patients History and Physical, chart, labs and discussed the procedure including the risks, benefits and alternatives for the proposed anesthesia with the patient or authorized representative who has indicated his/her understanding and acceptance.   Dental advisory given  Plan Discussed with: CRNA  Anesthesia Plan Comments:         Anesthesia Quick Evaluation

## 2017-08-06 NOTE — Progress Notes (Signed)
Nutrition Consult/Brief Note  RD consulted via Hip Fracture Protocol. Pt is currently in OR for surgical fixation of L femur and radius. RD to complete nutrition assessment at later date.  Maureen ChattersKatie Leslyn Monda, RD, LDN Pager #: [phone] After-Hours Pager #: [phone]

## 2017-08-06 NOTE — Consult Note (Addendum)
Orthopaedic Trauma Service (OTS) Consult   Patient ID: Crystal Pham MRN: [id number] DOB/AGE: February 03, 1941 77 y.o.  Reason for Consult:Left femur fracture and left distal radius fracture Referring Physician: Dr. Zadie Rhine, MD [facility]  HPI: Crystal Pham is an 77 y.o. female who is being seen in consultation at the request of Dr. Bebe Shaggy for evaluation of left femur fracture and left distal radius fracture.  The patient was at her daughter's home when she twisted and fell landed on her left side.  He immediate pain in her wrist and her femur.  She was brought to [facility] where x-rays showed a left femur fracture and a left distal radius fracture.  I was consulted for the femur fracture as well as the radius fracture is Dr. Mina Marble who is on for hand would be unable to perform surgical fixation of her wrist today.  Denies any pain to her right lower extremity or right upper extremity.  At baseline the patient ambulates with the use of a cane.  In January she had a right hip fracture that was treated with a cephalomedullary nail.  It sounds like she had a short nail that broke and she has not had it revised to a long nail.  She had done well from that.  This was done in [place].  Otherwise the patient is in good health.  No significant medical issues over the last few years.  She notes a small heart attack in 2009.  She has not had any subsequent problems since.  She denies any chest pain or shortness of breath.  She is a diabetic but she is not insulin-dependent.  She takes an oral medication daily.  She presents with her daughter and husband which are at bedside.  She was placed in Buck's traction yesterday evening  Past Medical History:  Diagnosis Date  . Arthritis   . Diabetes mellitus without complication (HCC)    Type II  . Heart attack (HCC)   . Hypertension   . Hypothyroidism   . Liver disorder   . Thyroid disease     Past Surgical History:  Procedure Laterality  Date  . ABDOMINAL HYSTERECTOMY    . APPENDECTOMY    . JOINT REPLACEMENT      Family History  Problem Relation Age of Onset  . Heart disease Mother   . Heart disease Father   . Heart disease Sister   . Heart disease Brother     Social History:  reports that she has quit smoking. She has never used smokeless tobacco. She reports that she does not drink alcohol or use drugs.  Allergies:  Allergies  Allergen Reactions  . Adhesive [Tape] Other (See Comments)    PAPER TAPE IS PREFERRED, PLEASE  . Oxycodone Other (See Comments)    Hallucinations, but tolerated morphine 02/28/2017   . Promethazine Nausea And Vomiting  . Levofloxacin Other (See Comments)    Tremors  . Motrin [Ibuprofen] Other (See Comments)    Patient was told to not take this    Medications:  No current facility-administered medications on file prior to encounter.    Current Outpatient Medications on File Prior to Encounter  Medication Sig Dispense Refill  . acetaminophen (TYLENOL) 500 MG tablet Take 500-1,000 mg by mouth every 6 (six) hours as needed (for pain).    Ailene Ards ELLIPTA 62.5-25 MCG/INH AEPB Inhale 1 puff into the lungs daily.  11  . aspirin 81 MG chewable tablet Chew 81 mg  by mouth every evening.    . Calcium Carb-Cholecalciferol (CALCIUM+D3 PO) Take 1 tablet by mouth every evening.    . Cholecalciferol (VITAMIN D-3 PO) Take 1 capsule by mouth every evening.    . ferrous sulfate (IRON SUPPLEMENT) 325 (65 FE) MG tablet Take 325 mg by mouth every Monday, Wednesday, and Friday.    Marland Kitchen. glimepiride (AMARYL) 1 MG tablet Take 0.5 mg by mouth daily with breakfast.    . guaiFENesin (MUCINEX) 600 MG 12 hr tablet Take 600 mg by mouth 2 (two) times daily.    Marland Kitchen. levothyroxine (SYNTHROID, LEVOTHROID) 75 MCG tablet Take 75 mcg by mouth daily.  11  . loperamide (IMODIUM A-D) 2 MG tablet Take 4 mg by mouth every 4 (four) hours as needed for diarrhea or loose stools.     Marland Kitchen. LORazepam (ATIVAN) 0.5 MG tablet Take 0.5 mg by  mouth every 6 (six) hours as needed for anxiety.    Marland Kitchen. losartan (COZAAR) 50 MG tablet Take 50 mg by mouth daily.    . metoprolol tartrate (LOPRESSOR) 25 MG tablet Take 25 mg by mouth 2 (two) times daily.    . Omega-3 Fatty Acids (FISH OIL) 1000 MG CAPS Take 1,000 mg by mouth every evening.    . ondansetron (ZOFRAN) 4 MG tablet Take 4 mg by mouth every 6 (six) hours as needed.    . predniSONE (DELTASONE) 10 MG tablet Take 10 mg by mouth daily as needed (for arthritis pain).     . ranitidine (ZANTAC) 150 MG tablet Take 150 mg by mouth 2 (two) times daily.    . traMADol (ULTRAM) 50 MG tablet Take 50 mg by mouth every 6 (six) hours as needed for pain.  1  . bismuth subsalicylate (PEPTO BISMOL) 262 MG/15ML suspension Take 30 mLs by mouth every 4 (four) hours as needed for diarrhea or loose stools.    . docusate sodium (COLACE) 100 MG capsule Take 100 mg by mouth 2 (two) times daily as needed.    Marland Kitchen. ipratropium-albuterol (DUONEB) 0.5-2.5 (3) MG/3ML SOLN Inhale 3 mLs into the lungs every 2 (two) hours as needed for shortness of breath.      ROS: Constitutional: No fever or chills Vision: No changes in vision ENT: No difficulty swallowing CV: No chest pain Pulm: No SOB or wheezing GI: No nausea or vomiting GU: No urgency or inability to hold urine Skin: No poor wound healing Neurologic: No numbness or tingling Psychiatric: No depression or anxiety Heme: No bruising Allergic: No reaction to medications or food   Exam: Blood pressure (!) 143/63, pulse 83, temperature 98.4 F (36.9 C), temperature source Oral, resp. rate 20, height 5' (1.524 m), weight 68.9 kg (152 lb), SpO2 94 %. General: No acute distress Orientation: Awake alert and oriented x3 Mood and Affect: Cooperative and pleasant Gait: Unable to assess due to her fractures Coordination and balance: Within normal limits  Left lower extremity: Buck's traction is in place.  Obvious deformity about the thigh.  Compartments are soft and  compressible.  Active dorsiflexion plantarflexion and great toe dorsiflexion plantarflexion.  Sensation is intact to the dorsum and plantar aspect of her foot.  She has 2+ DP pulse.  Unable to perform range of motion or stability exam on her knee or hip due to her fracture.  Left upper extremity: Arm is in sling.  Sugar tong splint is in place.  Is clean dry and intact.  Patient has motor and sensory function to the median, radial and ulnar  nerve distribution.  Compartments are soft and compressible.  Brisk cap refill less than 2 seconds.  No pain with left shoulder motion.    Medical Decision Making: Imaging: X-rays of the left femur show a midshaft transverse femur fracture.  There is significant shortening and angulation.  AP pelvis shows previous cephalo-medullary fixation which at the proximal portion shows no adverse features.  X-rays of the left wrist show impacted intra-articular distal radius fracture with dorsal angulation.  Labs:  Results for orders placed or performed during the hospital encounter of 08/05/17 (from the past 24 hour(s))  Basic metabolic panel     Status: Abnormal   Collection Time: 08/05/17 11:18 PM  Result Value Ref Range   Sodium 139 135 - 145 mmol/L   Potassium 4.1 3.5 - 5.1 mmol/L   Chloride 101 98 - 111 mmol/L   CO2 27 22 - 32 mmol/L   Glucose, Bld 153 (H) 70 - 99 mg/dL   BUN 13 8 - 23 mg/dL   Creatinine, Ser 1.61 0.44 - 1.00 mg/dL   Calcium 9.2 8.9 - 09.6 mg/dL   GFR calc non Af Amer 55 (L) >60 mL/min   GFR calc Af Amer >60 >60 mL/min   Anion gap 11 5 - 15  CBC with Differential/Platelet     Status: Abnormal   Collection Time: 08/05/17 11:18 PM  Result Value Ref Range   WBC 12.6 (H) 4.0 - 10.5 K/uL   RBC 3.46 (L) 3.87 - 5.11 MIL/uL   Hemoglobin 10.9 (L) 12.0 - 15.0 g/dL   HCT 04.5 (L) 40.9 - 81.1 %   MCV 101.4 (H) 78.0 - 100.0 fL   MCH 31.5 26.0 - 34.0 pg   MCHC 31.1 30.0 - 36.0 g/dL   RDW 91.4 78.2 - 95.6 %   Platelets 214 150 - 400 K/uL    Neutrophils Relative % 70 %   Neutro Abs 8.9 (H) 1.7 - 7.7 K/uL   Lymphocytes Relative 15 %   Lymphs Abs 1.8 0.7 - 4.0 K/uL   Monocytes Relative 12 %   Monocytes Absolute 1.5 (H) 0.1 - 1.0 K/uL   Eosinophils Relative 1 %   Eosinophils Absolute 0.1 0.0 - 0.7 K/uL   Basophils Relative 1 %   Basophils Absolute 0.1 0.0 - 0.1 K/uL   Immature Granulocytes 1 %   Abs Immature Granulocytes 0.2 (H) 0.0 - 0.1 K/uL  Protime-INR     Status: None   Collection Time: 08/05/17 11:18 PM  Result Value Ref Range   Prothrombin Time 12.7 11.4 - 15.2 seconds   INR 0.96   Type and screen     Status: None   Collection Time: 08/05/17 11:41 PM  Result Value Ref Range   ABO/RH(D) A POS    Antibody Screen NEG    Sample Expiration      08/08/2017 Performed at [facility], [address] [place], [place] [address]   ABO/Rh     Status: None   Collection Time: 08/05/17 11:41 PM  Result Value Ref Range   ABO/RH(D)      A POS Performed at [facility], [address] [place], [place] [address]   APTT     Status: None   Collection Time: 08/06/17  4:30 AM  Result Value Ref Range   aPTT 24 24 - 36 seconds  CBC     Status: Abnormal   Collection Time: 08/06/17  4:30 AM  Result Value Ref Range   WBC  15.5 (H) 4.0 - 10.5 K/uL   RBC 3.44 (L) 3.87 - 5.11 MIL/uL   Hemoglobin 10.9 (L) 12.0 - 15.0 g/dL   HCT 24.4 (L) 01.0 - 27.2 %   MCV 100.9 (H) 78.0 - 100.0 fL   MCH 31.7 26.0 - 34.0 pg   MCHC 31.4 30.0 - 36.0 g/dL   RDW 53.6 64.4 - 03.4 %   Platelets 193 150 - 400 K/uL  Basic metabolic panel     Status: Abnormal   Collection Time: 08/06/17  4:30 AM  Result Value Ref Range   Sodium 137 135 - 145 mmol/L   Potassium 4.0 3.5 - 5.1 mmol/L   Chloride 104 98 - 111 mmol/L   CO2 24 22 - 32 mmol/L   Glucose, Bld 188 (H) 70 - 99 mg/dL   BUN 14 8 - 23 mg/dL   Creatinine, Ser 7.42 0.44 - 1.00 mg/dL   Calcium 8.5 (L) 8.9 - 10.3 mg/dL   GFR calc non Af Amer 57 (L) >60 mL/min   GFR calc Af Amer >60 >60  mL/min   Anion gap 9 5 - 15   Chart was reviewed  Assessment/Plan: 77 year old female with a history of past medical history medical history including non-insulin-dependent diabetes, history of heart attack, coronary artery disease, hypothyroidism and liver disorder s/p fall  Injuries: 1.  Midshaft left femur fracture 2.  Left intra-articular distal radius fracture   I reviewed imaging findings with the patient and discuss need for surgical fixation of both her femur and her wrist.  We will plan for antegrade intramedullary nailing of left femur fracture. Risks discussed included bleeding requiring blood transfusion, bleeding causing a hematoma, infection, malunion, nonunion, damage to surrounding nerves and blood vessels, pain, hardware prominence or irritation, hardware failure, stiffness, post-traumatic arthritis, DVT/PE, compartment syndrome, and even death.  In regards to the left wrist.  I discussed this with Dr. Mina Marble I will take over care to allow for surgical fixation under 1 anesthesia.  I discussed with the patient and the family about potential nonoperative treatment including long-arm casting for 3 weeks and then short arm casting first 3 weeks and then removal wrist splint after that.  I felt that proceeding with surgical fixation would be most appropriate as it would allow for sooner range of motion and allow for platform walking and elbow motion which should be important for her recovery from her femur.  The patient and her family are in agreement with proceeding with both. Consent was obtained.  Plan for surgery later this morning.  Greater than 55 min was spent in reviewing imaging, discussing care with patient and her family, coordinating care with Dr. Bebe Shaggy and Dr. Mina Marble.  Roby Lofts, MD [facility] [phone] (phone)

## 2017-08-06 NOTE — Consult Note (Addendum)
WOC Nurse wound consult note Reason for Consult:left wrist skin tear Wound type:trauma Pressure Injury POA: NA Dressing procedure/placement/frequency:The patient has been seen by Ortho since the WOC consult order was placed. Ortho has sutured and wrapped arm and pt will go to OR later this AM. If consult needed post operatively please re-consult. Barnett HatterMelinda Lesslie Mossa, RN-C, WTA-C, OCA Wound Treatment Associate Ostomy Care Associate

## 2017-08-06 NOTE — Progress Notes (Signed)
Orthopedic Tech Progress Note Patient Details:  Lind Covertora J Roycroft Apr 15, 1940 [id number]  Musculoskeletal Traction Type of Traction: Bucks Skin Traction Traction Location: lle Traction Weight: 10 lbs   Post Interventions Patient Tolerated: Well Instructions Provided: Care of device, Adjustment of device   Trinna PostMartinez, Nathyn Luiz J 08/06/2017, 2:33 AM

## 2017-08-06 NOTE — ED Notes (Signed)
[facility]

## 2017-08-07 ENCOUNTER — Encounter (HOSPITAL_COMMUNITY): Payer: Self-pay | Admitting: Student

## 2017-08-07 DIAGNOSIS — E039 Hypothyroidism, unspecified: Secondary | ICD-10-CM

## 2017-08-07 DIAGNOSIS — D62 Acute posthemorrhagic anemia: Secondary | ICD-10-CM

## 2017-08-07 LAB — CBC
HEMATOCRIT: 28.7 % — AB (ref 36.0–46.0)
HEMOGLOBIN: 8.9 g/dL — AB (ref 12.0–15.0)
MCH: 31.6 pg (ref 26.0–34.0)
MCHC: 31 g/dL (ref 30.0–36.0)
MCV: 101.8 fL — AB (ref 78.0–100.0)
PLATELETS: 199 10*3/uL (ref 150–400)
RBC: 2.82 MIL/uL — ABNORMAL LOW (ref 3.87–5.11)
RDW: 14.4 % (ref 11.5–15.5)
WBC: 12.5 10*3/uL — ABNORMAL HIGH (ref 4.0–10.5)

## 2017-08-07 LAB — GLUCOSE, CAPILLARY
GLUCOSE-CAPILLARY: 210 mg/dL — AB (ref 70–99)
GLUCOSE-CAPILLARY: 189 mg/dL — AB (ref 70–99)
GLUCOSE-CAPILLARY: 217 mg/dL — AB (ref 70–99)
Glucose-Capillary: 136 mg/dL — ABNORMAL HIGH (ref 70–99)

## 2017-08-07 NOTE — Progress Notes (Addendum)
Occupational Therapy Evaluation Patient Details Name: Crystal Pham MRN: [id number] DOB: [birth year] Today's Date: 08/07/2017    History of Present Illness Patient is a 77 y/o female admitted after fall, s/p IM nail L femur and ORIF L distal radius fx. PMH signficant for but not limited to HTN, DM, GERD, CAD.    Clinical Impression   PTA patient was independent with ADL, IADL and mobility using cane.  Currently patient requires min assist for grooming, UB ADL with mod assist, LB ADL with max assist, and toilet transfers towards R side only with max assist.  Patient significantly limited due to L UE pain/weightbearing through elbow only, decreased activity tolerance, generalized weakness, fear of falling, and precautions.  Educated on precautions, mobility, and safety, initiated education on exercises to L UE, compensatory techniques, and sling use.  Patient will have 24/7 support at home from husband/daughter, but due to high level of physical assistance therapist recommends SNF to decrease burden of care prior to return home.  Will continue to follow while admitted.     Follow Up Recommendations  SNF;Supervision/Assistance - 24 hour(pt and family adament to dc home) ; if home will need HHOT    Equipment Recommendations  None recommended by OT(has all DME needed)   Recommendations for Other Services       Precautions / Restrictions Precautions Precautions: Fall Required Braces or Orthoses: Sling;Other Brace/Splint Other Brace/Splint: splinted distal L UE  Restrictions Weight Bearing Restrictions: Yes LUE Weight Bearing: Weight bear through elbow only LLE Weight Bearing: Weight bearing as tolerated      Mobility Bed Mobility Overal bed mobility: Needs Assistance Bed Mobility: Sit to Sidelying         Sit to sidelying: Min assist General bed mobility comments: mgmt of L LE into bed; increased time and cueing to adhere to L UE weight elbow only  Transfers Overall transfer  level: Needs assistance Equipment used: 1 person hand held assist Transfers: Stand Pivot Transfers   Stand pivot transfers: Max assist       General transfer comment: increased assist due to low surface and fatigue    Balance Overall balance assessment: Needs assistance Sitting-balance support: Feet supported;No upper extremity supported Sitting balance-Leahy Scale: Fair     Standing balance support: Single extremity supported;During functional activity Standing balance-Leahy Scale: Poor Standing balance comment: reliant on R UE and external support                           ADL either performed or assessed with clinical judgement   ADL Overall ADL's : Needs assistance/impaired Eating/Feeding: Set up;Sitting   Grooming: Minimal assistance;Sitting   Upper Body Bathing: Moderate assistance;Sitting;Cueing for UE precautions   Lower Body Bathing: Maximal assistance;Sit to/from stand;Cueing for compensatory techniques   Upper Body Dressing : Moderate assistance;Sitting   Lower Body Dressing: Maximal assistance;Sit to/from stand;Cueing for safety Lower Body Dressing Details (indicate cue type and reason): decreased reach and coordination due to limited use of L UE  Toilet Transfer: Maximal assistance;Stand-pivot;BSC(simulated to recliner ) Toilet Transfer Details (indicate cue type and reason): towards R side, max assist to ascend from low recliner; cueing for seqencing and safety; increased time required Toileting- Clothing Manipulation and Hygiene: Total assistance;Sit to/from stand       Functional mobility during ADLs: Maximal assistance(stand pivot only, towards R side) General ADL Comments: requires increased assistance due to decreased functional use of L UE, as well as pain  Vision Baseline Vision/History: Wears glasses Wears Glasses: Reading only Patient Visual Report: No change from baseline Vision Assessment?: No apparent visual deficits      Perception     Praxis      Pertinent Vitals/Pain Pain Assessment: Faces Faces Pain Scale: Hurts a little bit Pain Location: L UE Pain Descriptors / Indicators: Sore Pain Intervention(s): Monitored during session;Repositioned     Hand Dominance Right   Extremity/Trunk Assessment Upper Extremity Assessment Upper Extremity Assessment: Generalized weakness;LUE deficits/detail LUE Deficits / Details: limited ROM due to pain, splinted elbow to wrist, shoulder ROM within tolerance  LUE: Unable to fully assess due to immobilization(able to move fingers within tolerance) LUE Sensation: WNL LUE Coordination: decreased fine motor;decreased gross motor   Lower Extremity Assessment Lower Extremity Assessment: Defer to PT evaluation       Communication Communication Communication: No difficulties   Cognition Arousal/Alertness: Awake/alert Behavior During Therapy: WFL for tasks assessed/performed Overall Cognitive Status: Within Functional Limits for tasks assessed                                     General Comments  daughter present and supportive    Exercises Exercises: Hand exercises Hand Exercises Digit Composite Flexion: AROM;10 reps;Seated(to tolerance) Composite Extension: AROM;10 reps;Seated(to tolerance )   1555-1600: Returned to educated on sling use, exercises and elevation.  Patient demonstrates ability to complete gross flexion/extension digits, elbow flexion/extension, and shoulder ROM within tolerance; able to recall 3x/day rec's.  Patient removed sling due to neck discomfort, but agreeable to wear during transfers, mobility and sleep for protection.    Shoulder Instructions      Home Living Family/patient expects to be discharged to:: Private residence Living Arrangements: Spouse/significant other;Children Available Help at Discharge: Family;Available 24 hours/day Type of Home: House Home Access: Level entry     Home Layout: One level      Bathroom Shower/Tub: Producer, television/film/video: Standard(with 3:1 over toilet)     Home Equipment: Environmental consultant - 2 wheels;Bedside commode;Shower seat;Wheelchair - manual          Prior Functioning/Environment Level of Independence: Independent with assistive device(s)        Comments: independent with self care, IADL and mobility using SPC,         OT Problem List: Decreased strength;Decreased range of motion;Decreased activity tolerance;Impaired balance (sitting and/or standing);Decreased safety awareness;Decreased knowledge of use of DME or AE;Decreased knowledge of precautions;Pain      OT Treatment/Interventions: Self-care/ADL training;Energy conservation;Therapeutic exercise;DME and/or AE instruction;Therapeutic activities;Balance training;Patient/family education    OT Goals(Current goals can be found in the care plan section) Acute Rehab OT Goals Patient Stated Goal: go home OT Goal Formulation: With patient/family Time For Goal Achievement: 08/21/17 Potential to Achieve Goals: Good  OT Frequency: Min 3X/week   Barriers to D/C:            Co-evaluation              AM-PAC PT "6 Clicks" Daily Activity     Outcome Measure Help from another person eating meals?: A Little Help from another person taking care of personal grooming?: A Little Help from another person toileting, which includes using toliet, bedpan, or urinal?: Total Help from another person bathing (including washing, rinsing, drying)?: A Lot Help from another person to put on and taking off regular upper body clothing?: A Lot Help from another person to put on  and taking off regular lower body clothing?: Total 6 Click Score: 12   End of Session Equipment Utilized During Treatment: Gait belt(sling) Nurse Communication: Mobility status  Activity Tolerance: Patient tolerated treatment well;Patient limited by lethargy Patient left: in bed;with call bell/phone within reach  OT Visit  Diagnosis: Other abnormalities of gait and mobility (R26.89);Muscle weakness (generalized) (M62.81);Pain Pain - Right/Left: Left Pain - part of body: Arm                Time: [id number] OT Time Calculation (min): 26 min   1555-1600: no charges Charges:  OT General Charges $OT Visit: 1 Visit OT Evaluation $OT Eval High Complexity: 1 High OT Treatments $Self Care/Home Management : 8-22 mins G-Codes:     Chancy Milroyhristie S Omolola Mittman, OTR/L  Pager [phone]   Chancy MilroyChristie S Evalena Fujii 08/07/2017, 1:19 PM

## 2017-08-07 NOTE — Progress Notes (Signed)
PROGRESS NOTE  Crystal Pham J Vicknair WUJ:[id number] DOB: 1940-04-11 DOA: 08/05/2017 PCP: No primary care provider on file.  HPI/Recap of past 7724 hours: 77 year old female with past medical history of hypertension, CAD, hypothyroidism and diabetes mellitus presented to the emergency room on the night of 6/26 after she lost her balance on her stairs landing on her left side with severe pain in her left thigh and left wrist.  Brought into the emergency room and found to have a displaced left distal radius fracture, ulnar styloid fracture and displaced left mid femur diaphysis fracture.  Patient taken by orthopedics on 6/27 and underwent IM nail of left femur and ORIF of left distal radius fracture.  No complications.  This morning, complains of some soreness although she says she definitely feels better than the previous day.  Hemoglobin did drop as expected.  Assessment/Plan: Principal Problem: Fall status post closed femur fracture (HCC) and closed fracture of the distal radius, seen by orthopedic surgery plan to take patient to the operating room later today. Diabetes mellitus: Monitor CBGs, currently n.p.o. so sliding scale only.   Hypertension: Mildly elevated blood pressures, to be expected in the setting of pain.  Much improved today.   Hypothyroidism: Stable, continue Synthroid   CAD (coronary artery disease): Stable   GERD (gastroesophageal reflux disease) Overweight: Patient meets criteria with BMI greater than 25 Leukocytosis: Likely stress margination, down to 12.5 today. Acute blood loss anemia.  Hemoglobin at 10.9 yesterday and down to 8.9 today, as expected from surgery.  Patient also has an MCV of 101 with some microcytosis.  For now, we will just monitor  Code Status: Full code  Family Communication: Daughter at the bedside  Disposition Plan: Awaiting evaluation by PT/OT.  Family prefers home health, but may end up needing skilled nursing.   Consultants:  [place]:  Orthopedics  Procedures:  6/27:displaced left distal radius fracture, ulnar styloid fracture and displaced left mid femur diaphysis fracture.  Antimicrobials:  Preop Ancef  DVT prophylaxis: Subcutaneous heparin   Objective: Vitals:   08/06/17 1700 08/06/17 1955 08/06/17 2356 08/07/17 0500  BP: (!) 122/50 (!) 128/51 (!) 119/52 (!) 124/54  Pulse: 75 80 85 82  Resp:  15 14 16   Temp:  98.4 F (36.9 C) 98.2 F (36.8 C) 97.9 F (36.6 C)  TempSrc:  Oral Oral Oral  SpO2: 99% 98% 100% 99%  Weight:      Height:        Intake/Output Summary (Last 24 hours) at 08/07/2017 1108 Last data filed at 08/07/2017 0900 Gross per 24 hour  Intake 1980.86 ml  Output 1650 ml  Net 330.86 ml   Filed Weights   08/05/17 2300 08/06/17 1057  Weight: 68.9 kg (152 lb) 68.9 kg (152 lb)   Body mass index is 29.69 kg/m.  Exam:   General:   Alert and oriented, x3, no acute distress  Cardiovascular: Regular rate and rhythm, S1-S2  Respiratory: Clear to auscultation bilaterally  Abdomen: Soft, nontender, nondistended, positive bowel sounds  Musculoskeletal: No clubbing or cyanosis or edema.  Wrapped in sling.  Left lower extremity bandaged  Skin: No skin breaks, tears or lesions other left arm which is currently wrapped  Psychiatry: Appropriate, no evidence of psychoses  Neuro: No focal deficits   Data Reviewed: CBC: Recent Labs  Lab 08/05/17 2318 08/06/17 0430 08/07/17 0501  WBC 12.6* 15.5* 12.5*  NEUTROABS 8.9*  --   --   HGB 10.9* 10.9* 8.9*  HCT 35.1* 34.7* 28.7*  MCV  101.4* 100.9* 101.8*  PLT 214 193 199   Basic Metabolic Panel: Recent Labs  Lab 08/05/17 2318 08/06/17 0430  NA 139 137  K 4.1 4.0  CL 101 104  CO2 27 24  GLUCOSE 153* 188*  BUN 13 14  CREATININE 0.96 0.94  CALCIUM 9.2 8.5*   GFR: Estimated Creatinine Clearance: 42.7 mL/min (by C-G formula based on SCr of 0.94 mg/dL). Liver Function Tests: No results for input(s): AST, ALT, ALKPHOS, BILITOT,  PROT, ALBUMIN in the last 168 hours. No results for input(s): LIPASE, AMYLASE in the last 168 hours. No results for input(s): AMMONIA in the last 168 hours. Coagulation Profile: Recent Labs  Lab 08/05/17 2318  INR 0.96   Cardiac Enzymes: No results for input(s): CKTOTAL, CKMB, CKMBINDEX, TROPONINI in the last 168 hours. BNP (last 3 results) No results for input(s): PROBNP in the last 8760 hours. HbA1C: No results for input(s): HGBA1C in the last 72 hours. CBG: Recent Labs  Lab 08/06/17 1054 08/06/17 1457 08/06/17 1711 08/06/17 2129 08/07/17 0716  GLUCAP 110* 142* 187* 305* 136*   Lipid Profile: No results for input(s): CHOL, HDL, LDLCALC, TRIG, CHOLHDL, LDLDIRECT in the last 72 hours. Thyroid Function Tests: No results for input(s): TSH, T4TOTAL, FREET4, T3FREE, THYROIDAB in the last 72 hours. Anemia Panel: No results for input(s): VITAMINB12, FOLATE, FERRITIN, TIBC, IRON, RETICCTPCT in the last 72 hours. Urine analysis:    Component Value Date/Time   COLORURINE YELLOW 08/06/2017 0922   APPEARANCEUR CLEAR 08/06/2017 0922   LABSPEC 1.016 08/06/2017 0922   PHURINE 6.0 08/06/2017 0922   GLUCOSEU 50 (A) 08/06/2017 0922   HGBUR NEGATIVE 08/06/2017 0922   BILIRUBINUR NEGATIVE 08/06/2017 0922   KETONESUR NEGATIVE 08/06/2017 0922   PROTEINUR 30 (A) 08/06/2017 0922   NITRITE NEGATIVE 08/06/2017 0922   LEUKOCYTESUR NEGATIVE 08/06/2017 0922   Sepsis Labs: @LABRCNTIP (procalcitonin:4,lacticidven:4)  ) Recent Results (from the past 240 hour(s))  Surgical pcr screen     Status: None   Collection Time: 08/06/17 10:35 AM  Result Value Ref Range Status   MRSA, PCR NEGATIVE NEGATIVE Final   Staphylococcus aureus NEGATIVE NEGATIVE Final    Comment: (NOTE) The Xpert SA Assay (FDA approved for NASAL specimens in patients 60 years of age and older), is one component of a comprehensive surveillance program. It is not intended to diagnose infection nor to guide or monitor  treatment. Performed at [facility], [address], [place], [place] [address]       Studies: Dg Wrist Complete Left  Result Date: 08/06/2017 CLINICAL DATA:  ORIF of left wrist fracture EXAM: LEFT WRIST - COMPLETE 3+ VIEW COMPARISON:  Left wrist films of 08/05/2017 FINDINGS: Portable views of the left distal forearm and wrist show plate and screw fixation of the comminuted distal left radial fracture in good position. No complicating features are seen. Some anatomic detail is obscured by overlying splint material. IMPRESSION: ORIF of distal left radial fracture. Electronically Signed   By: Dwyane Dee M.D.   On: 08/06/2017 16:18   Dg Wrist Complete Left  Result Date: 08/06/2017 CLINICAL DATA:  ORIF of left distal radial fracture EXAM: LEFT WRIST - COMPLETE 3+ VIEW COMPARISON:  Left wrist films of 08/05/2017 FINDINGS: Four C-arm spot films show placement of fixation plate and screws for stabilization of the comminuted distal left radial fracture now in near anatomic position. IMPRESSION: ORIF of comminuted distal left radial fracture. Electronically Signed   By: Dwyane Dee M.D.   On: 08/06/2017 16:16  Dg C-arm 1-60 Min  Result Date: 08/06/2017 CLINICAL DATA:  ORIF of left wrist fracture EXAM: DG C-ARM 61-120 MIN COMPARISON:  Left wrist films of 08/05/2017 FINDINGS: C-arm fluoroscopy was provided during ORIF of the distal left radial fracture. Four C arm spot films were obtained. Fluoroscopy time of 29 seconds was recorded. IMPRESSION: Fluoroscopy provided during ORIF of distal left radial fracture. Electronically Signed   By: Dwyane Dee M.D.   On: 08/06/2017 16:15   Dg C-arm 1-60 Min  Result Date: 08/06/2017 CLINICAL DATA:  Left femur fracture EXAM: LEFT FEMUR 2 VIEWS; DG C-ARM 61-120 MIN COMPARISON:  08/06/2017 FLUOROSCOPY TIME:  Fluoroscopy Time:  3 minutes 8 seconds Radiation Exposure Index (if provided by the fluoroscopic device): Not available Number of Acquired Spot Images: 6  FINDINGS: Initial images again demonstrate the midshaft femoral fracture. Medullary rod is noted with proximal and distal fixation screws. Fracture fragments are in anatomic alignment. IMPRESSION: ORIF of left femoral fracture. Electronically Signed   By: Alcide Clever M.D.   On: 08/06/2017 15:14   Dg Femur Min 2 Views Left  Result Date: 08/06/2017 CLINICAL DATA:  Left femur fracture EXAM: LEFT FEMUR 2 VIEWS; DG C-ARM 61-120 MIN COMPARISON:  08/06/2017 FLUOROSCOPY TIME:  Fluoroscopy Time:  3 minutes 8 seconds Radiation Exposure Index (if provided by the fluoroscopic device): Not available Number of Acquired Spot Images: 6 FINDINGS: Initial images again demonstrate the midshaft femoral fracture. Medullary rod is noted with proximal and distal fixation screws. Fracture fragments are in anatomic alignment. IMPRESSION: ORIF of left femoral fracture. Electronically Signed   By: Alcide Clever M.D.   On: 08/06/2017 15:14   Dg Femur Port Min 2 Views Left  Result Date: 08/06/2017 CLINICAL DATA:  Postop IM nail fixation of left femoral fracture EXAM: LEFT FEMUR PORTABLE 2 VIEWS COMPARISON:  Left femur films of 08/06/2016 FINDINGS: IM nail has been placed for fixation of the mid left femoral fracture in good position and alignment. No complicating features are seen. IMPRESSION: IM nail fixation of mid left femoral fracture in good position and alignment. Electronically Signed   By: Dwyane Dee M.D.   On: 08/06/2017 16:17    Scheduled Meds: . calcium-vitamin D  1 tablet Oral QPM  . cholecalciferol  5,000 Units Oral QPM  . enoxaparin (LOVENOX) injection  40 mg Subcutaneous Q24H  . famotidine  20 mg Oral BID  . ferrous sulfate  325 mg Oral Q M,W,F  . guaiFENesin  600 mg Oral BID  . insulin aspart  0-5 Units Subcutaneous QHS  . insulin aspart  0-9 Units Subcutaneous TID WC  . levothyroxine  75 mcg Oral QAC breakfast  . losartan  50 mg Oral Daily  . metoprolol tartrate  25 mg Oral BID  . omega-3 acid ethyl  esters  1 g Oral Daily  . umeclidinium-vilanterol  1 puff Inhalation Daily    Continuous Infusions: .  ceFAZolin (ANCEF) IV 2 g (08/07/17 0457)  . lactated ringers 10 mL/hr at 08/06/17 2125  . lactated ringers 10 mL/hr at 08/06/17 1107     LOS: 1 day     Hollice Espy, MD [facility]  To reach me or the doctor on call, go to: [email or web address] Password [facility]  08/07/2017, 11:08 AM

## 2017-08-07 NOTE — Progress Notes (Signed)
Nutrition Follow Up/Brief Note   RD consulted via hip fracture protocol upon patient admission.  Pt is s/p procedures 6/27: INTRAMEDULLARY (IM) NAIL FEMORAL (Left ) OPEN REDUCTION INTERNAL FIXATION (ORIF) DISTAL RADIAL FRACTURE  Wt Readings from Last 15 Encounters:  08/06/17 152 lb (68.9 kg)   Body mass index is 29.69 kg/m. Patient meets criteria for Overweight based on current BMI.   Current diet order is Regular, patient is consuming approximately 100% of meals at this time.   Labs and medications reviewed. No nutrition interventions warranted at this time.   If nutrition issues arise, please re-consult RD.   Maureen ChattersKatie Milagros Middendorf, RD, LDN Pager #: [phone] After-Hours Pager #: [phone]

## 2017-08-07 NOTE — Progress Notes (Signed)
Orthopaedic Trauma Progress Note  S: Doing well, not having much pain. Nerve block to left arm has worn off. Denies shortness of breath. Has some left sided chest pain but appears MSK in nature  O:  Vitals:   08/06/17 2356 08/07/17 0500  BP: (!) 119/52 (!) 124/54  Pulse: 85 82  Resp: 14 16  Temp: 98.2 F (36.8 C) 97.9 F (36.6 C)  SpO2: 100% 99%   Gen: NAD LUE: Splint in place, clean, dry and intact. +Medial/radial/ulnar nerve motor and sensory function. Warm and well perfused fingers  LLE: Dressings clean, dry and intact. +EHL/TA/GSC. Sensation intact to dorsum and plantar aspect of foot. Warm and well perfused foot  Imaging: Stable post op imaging  Labs:  Results for orders placed or performed during the hospital encounter of 08/05/17 (from the past 24 hour(s))  Glucose, capillary     Status: Abnormal   Collection Time: 08/06/17  7:09 AM  Result Value Ref Range   Glucose-Capillary 155 (H) 70 - 99 mg/dL  Urinalysis, Routine w reflex microscopic     Status: Abnormal   Collection Time: 08/06/17  9:22 AM  Result Value Ref Range   Color, Urine YELLOW YELLOW   APPearance CLEAR CLEAR   Specific Gravity, Urine 1.016 1.005 - 1.030   pH 6.0 5.0 - 8.0   Glucose, UA 50 (A) NEGATIVE mg/dL   Hgb urine dipstick NEGATIVE NEGATIVE   Bilirubin Urine NEGATIVE NEGATIVE   Ketones, ur NEGATIVE NEGATIVE mg/dL   Protein, ur 30 (A) NEGATIVE mg/dL   Nitrite NEGATIVE NEGATIVE   Leukocytes, UA NEGATIVE NEGATIVE   RBC / HPF 0-5 0 - 5 RBC/hpf   WBC, UA 0-5 0 - 5 WBC/hpf   Bacteria, UA NONE SEEN NONE SEEN   Squamous Epithelial / LPF 0-5 0 - 5   Mucus PRESENT   Surgical pcr screen     Status: None   Collection Time: 08/06/17 10:35 AM  Result Value Ref Range   MRSA, PCR NEGATIVE NEGATIVE   Staphylococcus aureus NEGATIVE NEGATIVE  Glucose, capillary     Status: Abnormal   Collection Time: 08/06/17 10:54 AM  Result Value Ref Range   Glucose-Capillary 110 (H) 70 - 99 mg/dL   Comment 1 Notify RN     Comment 2 Document in Chart   Glucose, capillary     Status: Abnormal   Collection Time: 08/06/17  2:57 PM  Result Value Ref Range   Glucose-Capillary 142 (H) 70 - 99 mg/dL  Glucose, capillary     Status: Abnormal   Collection Time: 08/06/17  5:11 PM  Result Value Ref Range   Glucose-Capillary 187 (H) 70 - 99 mg/dL   Comment 1 Notify RN    Comment 2 Document in Chart   Glucose, capillary     Status: Abnormal   Collection Time: 08/06/17  9:29 PM  Result Value Ref Range   Glucose-Capillary 305 (H) 70 - 99 mg/dL  CBC     Status: Abnormal   Collection Time: 08/07/17  5:01 AM  Result Value Ref Range   WBC 12.5 (H) 4.0 - 10.5 K/uL   RBC 2.82 (L) 3.87 - 5.11 MIL/uL   Hemoglobin 8.9 (L) 12.0 - 15.0 g/dL   HCT 16.1 (L) 09.6 - 04.5 %   MCV 101.8 (H) 78.0 - 100.0 fL   MCH 31.6 26.0 - 34.0 pg   MCHC 31.0 30.0 - 36.0 g/dL   RDW 40.9 81.1 - 91.4 %   Platelets 199 150 -  400 K/uL    Assessment: 77 year old female hx NIDDM, CAD, hypothryoidism s/p fall  Injuries: 1. Left femur fracture s/p IMN 2. ORIF of left distal radius fracture  Weightbearing: WBAT LLE, NWB LUE, okay for WB thru elbow for platform walker  Insicional and dressing care: Splint to LUE until follow up, Dressings to LLE until POD2  Orthopedic device(s):Splint to LUE  CV/Blood loss:Acute blood loss anemia 10.9-->8.9 this AM. Monitor for now, recheck tomorrow AM. Hemodynamics stable this AM  Pain management: Tramadol PRN  VTE prophylaxis: Lovenox 40mg  to start today  ID: Ancef for 24 hrs  Foley/Lines: D/C foley today after therapy  Impediments to Fracture Healing: Osteoporosis, hypothyroidism  Dispo: PT and OT to eval, family would like HH PT  Follow - up plan: TBD   Roby LoftsKevin P. Gidget Quizhpi, MD [facility] [phone] (phone)

## 2017-08-07 NOTE — Evaluation (Signed)
Physical Therapy Evaluation Patient Details Name: Crystal Pham MRN: [id number] DOB: [birth year] Today's Date: 08/07/2017   History of Present Illness  Patient is a 77 y/o female admitted after fall, s/p IM nail L femur and ORIF L distal radius fx. PMH signficant for but not limited to HTN, DM, GERD, CAD.     Clinical Impression  Pt presented supine in bed with HOB elevated, awake and willing to participate in therapy session. Prior to admission, pt reported that she was ambulating with SPC and independent with ADLs. Pt currently requires mod A for bed mobility and min-mod A for transfers with 1HHA. Pt would greatly benefit from skilled PT for ST rehab at a SNF prior to returning home with family; however, pt and family are very adamant about returning home. If pt d/c's home, she will need HHPT and 24/7 supervision/physical assistance. Pt would continue to benefit from skilled physical therapy services at this time while admitted and after d/c to address the below listed limitations in order to improve overall safety and independence with functional mobility.     Follow Up Recommendations SNF;Other (comment)(pt and family are adamant about returning home)    Equipment Recommendations  None recommended by PT    Recommendations for Other Services       Precautions / Restrictions Precautions Precautions: Fall Required Braces or Orthoses: Sling;Other Brace/Splint Other Brace/Splint: splinted distal L UE  Restrictions Weight Bearing Restrictions: Yes LUE Weight Bearing: Weight bear through elbow only LLE Weight Bearing: Weight bearing as tolerated      Mobility  Bed Mobility Overal bed mobility: Needs Assistance Bed Mobility: Supine to Sit     Supine to sit: Mod assist   Sit to sidelying: Min assist General bed mobility comments: increased time and effort, assist to move L LE off of bed, pt able to use R UE on bed to help elevate trunk  Transfers Overall transfer level: Needs  assistance Equipment used: 1 person hand held assist Transfers: Sit to/from [employer] Sit to Stand: Mod assist Stand pivot transfers: Min assist       General transfer comment: increased time and effort, cueing for technique and safety, mod A to stand fully upright and min A to pivot to chair towards her R  Ambulation/Gait                Stairs            Wheelchair Mobility    Modified Rankin (Stroke Patients Only)       Balance Overall balance assessment: Needs assistance Sitting-balance support: Feet supported;No upper extremity supported Sitting balance-Leahy Scale: Fair     Standing balance support: Single extremity supported;During functional activity Standing balance-Leahy Scale: Poor Standing balance comment: reliant on R UE and external support                             Pertinent Vitals/Pain Pain Assessment: Faces Faces Pain Scale: Hurts a little bit Pain Location: L UE Pain Descriptors / Indicators: Sore Pain Intervention(s): Monitored during session;Repositioned    Home Living Family/patient expects to be discharged to:: Private residence Living Arrangements: Spouse/significant other;Children Available Help at Discharge: Family;Available 24 hours/day Type of Home: House Home Access: Level entry     Home Layout: One level Home Equipment: Walker - 2 wheels;Bedside commode;Shower seat;Wheelchair - manual      Prior Function Level of Independence: Independent with assistive device(s)  Comments: independent with self care, IADL and mobility using SPC,      Hand Dominance   Dominant Hand: Right    Extremity/Trunk Assessment   Upper Extremity Assessment Upper Extremity Assessment: Defer to OT evaluation LUE Deficits / Details: limited ROM due to pain, splinted elbow to wrist, shoulder ROM within tolerance  LUE: Unable to fully assess due to immobilization(able to move fingers within  tolerance) LUE Sensation: WNL LUE Coordination: decreased fine motor;decreased gross motor    Lower Extremity Assessment Lower Extremity Assessment: Generalized weakness;LLE deficits/detail LLE Deficits / Details: pt with decreased strength and ROM limitations secondary to post-op pain and weakness       Communication   Communication: No difficulties  Cognition Arousal/Alertness: Awake/alert Behavior During Therapy: WFL for tasks assessed/performed Overall Cognitive Status: Within Functional Limits for tasks assessed                                        General Comments General comments (skin integrity, edema, etc.): daughter present and supportive    Exercises Hand Exercises Digit Composite Flexion: AROM;10 reps;Seated(to tolerance) Composite Extension: AROM;10 reps;Seated(to tolerance )   Assessment/Plan    PT Assessment Patient needs continued PT services  PT Problem List Decreased strength;Decreased range of motion;Decreased activity tolerance;Decreased balance;Decreased mobility;Decreased coordination;Decreased knowledge of use of DME;Decreased safety awareness;Decreased knowledge of precautions;Cardiopulmonary status limiting activity       PT Treatment Interventions DME instruction;Gait training;Stair training;Functional mobility training;Therapeutic activities;Therapeutic exercise;Balance training;Neuromuscular re-education;Patient/family education    PT Goals (Current goals can be found in the Care Plan section)  Acute Rehab PT Goals Patient Stated Goal: return home PT Goal Formulation: With patient/family Time For Goal Achievement: 08/21/17 Potential to Achieve Goals: Good    Frequency Min 3X/week   Barriers to discharge        Co-evaluation               AM-PAC PT "6 Clicks" Daily Activity  Outcome Measure Difficulty turning over in bed (including adjusting bedclothes, sheets and blankets)?: Unable Difficulty moving from lying  on back to sitting on the side of the bed? : Unable Difficulty sitting down on and standing up from a chair with arms (e.g., wheelchair, bedside commode, etc,.)?: Unable Help needed moving to and from a bed to chair (including a wheelchair)?: A Little Help needed walking in hospital room?: Total Help needed climbing 3-5 steps with a railing? : Total 6 Click Score: 8    End of Session Equipment Utilized During Treatment: Gait belt;Other (comment)(L UE sling) Activity Tolerance: Patient limited by fatigue Patient left: in chair;with call bell/phone within reach;with family/visitor present Nurse Communication: Mobility status PT Visit Diagnosis: Other abnormalities of gait and mobility (R26.89);Muscle weakness (generalized) (M62.81);Pain Pain - Right/Left: Left Pain - part of body: Arm;Leg    Time: [id number] PT Time Calculation (min) (ACUTE ONLY): 30 min   Charges:   PT Evaluation $PT Eval Moderate Complexity: 1 Mod     PT G Codes:        MaconJennifer Quinnlan Abruzzo, PT, DPT [phone]   Alessandra BevelsJennifer M Nasirah Sachs 08/07/2017, 2:36 PM

## 2017-08-07 NOTE — Progress Notes (Signed)
Inpatient Diabetes Program Recommendations  AACE/ADA: New Consensus Statement on Inpatient Glycemic Control (2015)  Target Ranges:  Prepandial:   less than 140 mg/dL      Peak postprandial:   less than 180 mg/dL (1-2 hours)      Critically ill patients:  140 - 180 mg/dL   Results for Lind CovertCRAVEN, Krystol J (MRN [id number]) as of 08/07/2017 12:59  Ref. Range 08/06/2017 07:09 08/06/2017 10:54 08/06/2017 14:57 08/06/2017 17:11 08/06/2017 21:29 08/07/2017 07:16 08/07/2017 12:03  Glucose-Capillary Latest Ref Range: 70 - 99 mg/dL 308155 (H) 657110 (H) 846142 (H) 187 (H) 305 (H) 136 (H) 210 (H)   Review of Glycemic Control  Diabetes history: DM 2 Outpatient Diabetes medications: Amaryl 0.5 mg Daily Current orders for Inpatient glycemic control: Novolog Sensitive Correction 0-9 units tid, Novolog HS scale 0-5 units QHS  Inpatient Diabetes Program Recommendations:    Decadron 10 mg given yesterday. Glucose trends running higher today. Tomorrow trends should trend downward. Will watch trends.  Thanks,  Christena DeemShannon Razi Hickle RN, MSN, BC-ADM, [facility] Inpatient Diabetes Coordinator Team Pager [phone] (8a-5p)

## 2017-08-08 DIAGNOSIS — S72402K Unspecified fracture of lower end of left femur, subsequent encounter for closed fracture with nonunion: Secondary | ICD-10-CM

## 2017-08-08 LAB — CBC
HEMATOCRIT: 32.3 % — AB (ref 36.0–46.0)
HEMOGLOBIN: 9.4 g/dL — AB (ref 12.0–15.0)
MCH: 32 pg (ref 26.0–34.0)
MCHC: 29.1 g/dL — AB (ref 30.0–36.0)
MCV: 109.9 fL — ABNORMAL HIGH (ref 78.0–100.0)
Platelets: 214 10*3/uL (ref 150–400)
RBC: 2.94 MIL/uL — AB (ref 3.87–5.11)
RDW: 14.7 % (ref 11.5–15.5)
WBC: 15.3 10*3/uL — ABNORMAL HIGH (ref 4.0–10.5)

## 2017-08-08 LAB — GLUCOSE, CAPILLARY
GLUCOSE-CAPILLARY: 92 mg/dL (ref 70–99)
GLUCOSE-CAPILLARY: 108 mg/dL — AB (ref 70–99)
GLUCOSE-CAPILLARY: 136 mg/dL — AB (ref 70–99)

## 2017-08-08 MED ORDER — METHOCARBAMOL 500 MG PO TABS: 500.0000 mg | ORAL_TABLET | Freq: Three times a day (TID) | ORAL | 0 refills | Status: DC | PRN

## 2017-08-08 MED ORDER — METHOCARBAMOL 500 MG PO TABS: 500.0000 mg | ORAL_TABLET | Freq: Three times a day (TID) | ORAL | 0 refills | Status: AC | PRN

## 2017-08-08 MED ORDER — TRAMADOL HCL 50 MG PO TABS: 50.0000 mg | ORAL_TABLET | Freq: Four times a day (QID) | ORAL | 1 refills | Status: AC | PRN

## 2017-08-08 NOTE — Progress Notes (Signed)
Occupational Therapy Treatment Patient Details Name: Crystal Pham J Dinneen MRN: [id number] DOB: Feb 03, 1941 Today's Date: 08/08/2017    History of present illness Patient is a 77 y/o female admitted after fall, s/p IM nail L femur and ORIF L distal radius fx. PMH signficant for but not limited to HTN, DM, GERD, CAD.    OT comments  Pt progressing towards goals this session, improved ROM in RUE and reports that she has been doing digit, elbow and shoulder exercises 15 reps 3x a day. Bed mobility at mod A with verbal cues to maintain NWB through L wrist. Pt able to maintain sitting balance EOB for grooming tasks prior to PT entering to work on transfers. Pt requires Max A on right side for power up into standing and vc for use of platform walker. OT will continue to follow prior to dc. Pt will require post-acute therapy to maximize safety and independence.  Follow Up Recommendations  SNF;Supervision/Assistance - 24 hour(Pt and family adamant to dc home)    Equipment Recommendations  None recommended by OT(Pt has appropriate DME from OT standpoint)    Recommendations for Other Services      Precautions / Restrictions Precautions Precautions: Fall Required Braces or Orthoses: Sling;Other Brace/Splint Other Brace/Splint: splinted distal L UE  Restrictions Weight Bearing Restrictions: Yes LUE Weight Bearing: Weight bear through elbow only LLE Weight Bearing: Weight bearing as tolerated       Mobility Bed Mobility Overal bed mobility: Needs Assistance Bed Mobility: Supine to Sit     Supine to sit: Mod assist     General bed mobility comments: Assist to bring hips to the EOB with bed pad, and trunk elevation  Transfers Overall transfer level: Needs assistance Equipment used: Left platform walker Transfers: Sit to/from Stand Sit to Stand: Max assist         General transfer comment: increased time and effort, cueing for technique and safety, max A to achieve full upright standing from  EOB    Balance Overall balance assessment: Needs assistance Sitting-balance support: Feet supported;No upper extremity supported Sitting balance-Leahy Scale: Fair     Standing balance support: Single extremity supported;During functional activity;Bilateral upper extremity supported Standing balance-Leahy Scale: Poor                             ADL either performed or assessed with clinical judgement   ADL Overall ADL's : Needs assistance/impaired     Grooming: Sitting;Set up;Wash/dry face;Oral care                   Toilet Transfer: Maximal assistance;Stand-pivot;RW;Cueing for sequencing;BSC(L platform RW) Toilet Transfer Details (indicate cue type and reason): max A on Right side for power up to standing, vc for use of platform with reinforcement of WB precautions Toileting- Clothing Manipulation and Hygiene: Total assistance;Sit to/from stand       Functional mobility during ADLs: Moderate assistance;+2 for physical assistance;+2 for safety/equipment;Rolling walker;Cueing for sequencing       Vision       Perception     Praxis      Cognition Arousal/Alertness: Awake/alert Behavior During Therapy: WFL for tasks assessed/performed Overall Cognitive Status: Impaired/Different from baseline Area of Impairment: Problem solving;Memory                     Memory: Decreased short-term memory;Decreased recall of precautions       Problem Solving: Difficulty sequencing;Requires verbal cues;Requires tactile cues  Exercises Exercises: Hand exercises Hand Exercises Digit Composite Flexion: AROM;10 reps;Seated Composite Extension: AROM;10 reps;Seated   Shoulder Instructions       General Comments      Pertinent Vitals/ Pain       Pain Assessment: Faces Faces Pain Scale: Hurts a little bit Pain Location: L wrist Pain Descriptors / Indicators: Sore Pain Intervention(s): Monitored during session  Home Living                                           Prior Functioning/Environment              Frequency  Min 3X/week        Progress Toward Goals  OT Goals(current goals can now be found in the care plan section)  Progress towards OT goals: Progressing toward goals  Acute Rehab OT Goals Patient Stated Goal: return home OT Goal Formulation: With patient Time For Goal Achievement: 08/21/17 Potential to Achieve Goals: Good  Plan Discharge plan remains appropriate;Frequency remains appropriate    Co-evaluation                 AM-PAC PT "6 Clicks" Daily Activity     Outcome Measure   Help from another person eating meals?: A Little Help from another person taking care of personal grooming?: A Little Help from another person toileting, which includes using toliet, bedpan, or urinal?: Total Help from another person bathing (including washing, rinsing, drying)?: A Lot Help from another person to put on and taking off regular upper body clothing?: A Lot Help from another person to put on and taking off regular lower body clothing?: Total 6 Click Score: 12    End of Session Equipment Utilized During Treatment: Gait belt;Rolling walker  OT Visit Diagnosis: Other abnormalities of gait and mobility (R26.89);Muscle weakness (generalized) (M62.81);Pain Pain - Right/Left: Left Pain - part of body: Arm   Activity Tolerance Patient tolerated treatment well   Patient Left in chair;with call bell/phone within reach;with nursing/sitter in room   Nurse Communication Mobility status;Other (comment)(IV site bleeding)        Time: 1610-9604 OT Time Calculation (min): 11 min  Charges: OT General Charges $OT Visit: 1 Visit OT Treatments $Therapeutic Activity: 8-22 mins  Sherryl Manges OTR/L [phone]   Evern Bio Delando Satter 08/08/2017, 1:46 PM

## 2017-08-08 NOTE — Progress Notes (Signed)
Faxed D/C summary, face to face encounter, HH order, PT eval and OT notes to [facility].

## 2017-08-08 NOTE — Care Management Note (Signed)
Case Management Note  Patient Details  Name: OLINE BELK MRN: [id number] Date of Birth: 1941/02/04  Subjective/Objective: 77 yo F admitted after fall, s/p IM nail L femur and ORIF L distal radius fx.                    Action/Plan: Referral received to assist pt with [facility] PT, OT, aide   Expected Discharge Date:  08/08/17               Expected Discharge Plan:  [place]  In-House Referral:     Discharge planning Services  CM Consult  Post Acute Care Choice:  Home Health Choice offered to:  Spouse  DME Arranged:  (L platform) DME Agency:  [place] Arranged:  PT, OT, Nurse's Aide [place] Agency:  [facility]  Status of Service:  Completed, signed off  If discussed at [place] of Stay Meetings, dates discussed:    Additional Comments: Received call from MD. Per MD, PT is recommending SNF, but family wants to take pt home with [facility]. Met with husband. She has DME from previous surgery. He reports that she has used [facility] in [place] and they prefer to use them again. Contacted [facility] at [phone], spoke with receptionist and she will contact the nurse on call. Fax # [phone]. Received a call from Raquel Sarna who stated that she can't accept the referral until they contact pt's insurance on Monday to verify [facility] benefit and to make sure that they are in network. She reports that they will f/u with the pt after insurance verification. Met with husband again. He is agreeable to wait until Monday to see if [facility] can accept the referral. Will fax D/C summary once available. Contacted Reggie at [place] for L platform.    Norina Buzzard, RN 08/08/2017, 3:20 PM

## 2017-08-08 NOTE — Discharge Summary (Signed)
Discharge Summary  CHRISETTE MAN ZOX:[id number] DOB: February 23, 1940  PCP: No primary care provider on file.  Admit date: 08/05/2017 Discharge date: 08/08/2017  Time spent: 25 minutes  Recommendations for Outpatient Follow-up:  1. Patient will follow-up with home health PT OT and aide 2. She been given a prescription for Ultram 50 mg every 4 hours as needed  Discharge Diagnoses:  Active Hospital Problems   Diagnosis Date Noted  . Fall   . Closed femur fracture (HCC) 08/06/2017  . Hypothyroidism 08/06/2017  . CAD (coronary artery disease) 08/06/2017  . GERD (gastroesophageal reflux disease) 08/06/2017  . Closed fracture of left distal radius   . Laceration of left wrist   . Hypertension     Resolved Hospital Problems  No resolved problems to display.    Discharge Condition: Improved, being discharged home with home health  Diet recommendation: Heart healthy diet, carb modified  Vitals:   08/07/17 2003 08/08/17 0538  BP: (!) 107/41 (!) 138/96  Pulse: 87 83  Resp: 17 18  Temp: 98.5 F (36.9 C) 97.7 F (36.5 C)  SpO2: 90% 93%    History of present illness:  77 year old female with past medical history of hypertension, CAD, hypothyroidism and diabetes mellitus presented to the emergency room on the night of 6/26 after she lost her balance on her stairs landing on her left side with severe pain in her left thigh and left wrist.  Brought into the emergency room and found to have a displaced left distal radius fracture, ulnar styloid fracture and displaced left mid femur diaphysis fracture.     Hospital Course:  Principal Problem: Fall status post closed femur fracture (HCC) and closed fracture of the distal radius,Patient taken by orthopedics on 6/27 and underwent IM nail of left femur and ORIF of left distal radius fracture.    Postop, seen by PT and OT who recommended skilled nursing, but patient and her family adamantly refused.  Home health PT OT and aide set up.  Patient  will follow-up with orthopedic surgery in 2 to 3 weeks.  She is allowed to have some weightbearing as tolerated of her left leg, but no weightbearing of her left arm.  PRN Ultram for pain.   Diabetes mellitus: CBGs stable during hospitalization   Hypertension: Mildly elevated blood pressures, to be expected in the setting of pain.    Improved by discharge   Hypothyroidism: Stable, continue Synthroid   CAD (coronary artery disease): Stable   GERD (gastroesophageal reflux disease) Overweight: Patient meets criteria with BMI greater than 25 Leukocytosis: Likely stress margination.  Chest x-ray and urinalysis unremarkable Acute blood loss anemia.  Hemoglobin at 10.9 on day of surgery and down to 8.9 today, as expected from surgery.  Patient also has an MCV of 101 with some microcytosis.  Improved to 9.4 by day of discharge    Consultants:  Haddix: Orthopedics  Procedures:  6/27:displaced left distal radius fracture, ulnar styloid fracture and displaced left mid femur diaphysis fracture.    Discharge Exam: BP (!) 138/96 (BP Location: Right Arm)   Pulse 83   Temp 97.7 F (36.5 C) (Oral)   Resp 18   Ht 5' (1.524 m)   Wt 68.9 kg (152 lb)   SpO2 93%   BMI 29.69 kg/m   General: Alert and oriented x3, no acute distress Cardiovascular: Regular rate and rhythm, S1-S2 Respiratory: Clear to auscultation bilaterally  Discharge Instructions You were cared for by a hospitalist during your hospital stay. If you have  any questions about your discharge medications or the care you received while you were in the hospital after you are discharged, you can call the unit and asked to speak with the hospitalist on call if the hospitalist that took care of you is not available. Once you are discharged, your primary care physician will handle any further medical issues. Please note that NO REFILLS for any discharge medications will be authorized once you are discharged, as it is imperative that you  return to your primary care physician (or establish a relationship with a primary care physician if you do not have one) for your aftercare needs so that they can reassess your need for medications and monitor your lab values.  Discharge Instructions    Diet - low sodium heart healthy   Complete by:  As directed    Increase activity slowly   Complete by:  As directed      Allergies as of 08/08/2017      Reactions   Adhesive [tape] Other (See Comments)   PAPER TAPE IS PREFERRED, PLEASE   Oxycodone Other (See Comments)   Hallucinations, but tolerated morphine 02/28/2017   Promethazine Nausea And Vomiting   Levofloxacin Other (See Comments)   Tremors   Motrin [ibuprofen] Other (See Comments)   Patient was told to not take this      Medication List    TAKE these medications   acetaminophen 500 MG tablet Commonly known as:  TYLENOL Take 500-1,000 mg by mouth every 6 (six) hours as needed (for pain).   ANORO ELLIPTA 62.5-25 MCG/INH Aepb Generic drug:  umeclidinium-vilanterol Inhale 1 puff into the lungs daily.   aspirin 81 MG chewable tablet Chew 81 mg by mouth every evening.   bismuth subsalicylate 262 MG/15ML suspension Commonly known as:  PEPTO BISMOL Take 30 mLs by mouth every 4 (four) hours as needed for diarrhea or loose stools.   CALCIUM+D3 PO Take 1 tablet by mouth every evening.   docusate sodium 100 MG capsule Commonly known as:  COLACE Take 100 mg by mouth 2 (two) times daily as needed.   Fish Oil 1000 MG Caps Take 1,000 mg by mouth every evening.   glimepiride 1 MG tablet Commonly known as:  AMARYL Take 0.5 mg by mouth daily with breakfast.   guaiFENesin 600 MG 12 hr tablet Commonly known as:  MUCINEX Take 600 mg by mouth 2 (two) times daily.   ipratropium-albuterol 0.5-2.5 (3) MG/3ML Soln Commonly known as:  DUONEB Inhale 3 mLs into the lungs every 2 (two) hours as needed for shortness of breath.   IRON SUPPLEMENT 325 (65 FE) MG tablet Generic drug:   ferrous sulfate Take 325 mg by mouth every Monday, Wednesday, and Friday.   levothyroxine 75 MCG tablet Commonly known as:  SYNTHROID, LEVOTHROID Take 75 mcg by mouth daily.   loperamide 2 MG tablet Commonly known as:  IMODIUM A-D Take 4 mg by mouth every 4 (four) hours as needed for diarrhea or loose stools.   LORazepam 0.5 MG tablet Commonly known as:  ATIVAN Take 0.5 mg by mouth every 6 (six) hours as needed for anxiety.   losartan 50 MG tablet Commonly known as:  COZAAR Take 50 mg by mouth daily.   methocarbamol 500 MG tablet Commonly known as:  ROBAXIN Take 1 tablet (500 mg total) by mouth every 8 (eight) hours as needed for muscle spasms.   metoprolol tartrate 25 MG tablet Commonly known as:  LOPRESSOR Take 25 mg by mouth 2 (  two) times daily.   ondansetron 4 MG tablet Commonly known as:  ZOFRAN Take 4 mg by mouth every 6 (six) hours as needed.   predniSONE 10 MG tablet Commonly known as:  DELTASONE Take 10 mg by mouth daily as needed (for arthritis pain).   ranitidine 150 MG tablet Commonly known as:  ZANTAC Take 150 mg by mouth 2 (two) times daily.   traMADol 50 MG tablet Commonly known as:  ULTRAM Take 50 mg by mouth every 6 (six) hours as needed for pain.   VITAMIN D-3 PO Take 1 capsule by mouth every evening.      Allergies  Allergen Reactions  . Adhesive [Tape] Other (See Comments)    PAPER TAPE IS PREFERRED, PLEASE  . Oxycodone Other (See Comments)    Hallucinations, but tolerated morphine 02/28/2017   . Promethazine Nausea And Vomiting  . Levofloxacin Other (See Comments)    Tremors  . Motrin [Ibuprofen] Other (See Comments)    Patient was told to not take this   Follow-up Information    Haddix, Gillie Manners, MD. Schedule an appointment as soon as possible for a visit in 2 week(s).   Specialty:  Orthopedic Surgery Contact information: [address] [place] [address] [phone]            The results of significant  diagnostics from this hospitalization (including imaging, microbiology, ancillary and laboratory) are listed below for reference.    Significant Diagnostic Studies: Dg Chest 1 View  Result Date: 08/06/2017 CLINICAL DATA:  Patient lost her balance and fell this evening. Pain. EXAM: CHEST  1 VIEW COMPARISON:  12/26/2016 FINDINGS: Borderline cardiomegaly as before. Moderate aortic atherosclerosis without aneurysm. Chronic interstitial prominence and pulmonary hyperinflation consistent with COPD. No pneumothorax or pulmonary consolidations. No pleural effusion. No acute osseous appearing abnormality. IMPRESSION: Chronic interstitial prominence and pulmonary hyperinflation consistent with COPD. Moderate aortic atherosclerosis. No acute osseous appearing abnormality. Electronically Signed   By: Tollie Eth M.D.   On: 08/06/2017 01:12   Dg Wrist Complete Left  Result Date: 08/06/2017 CLINICAL DATA:  ORIF of left wrist fracture EXAM: LEFT WRIST - COMPLETE 3+ VIEW COMPARISON:  Left wrist films of 08/05/2017 FINDINGS: Portable views of the left distal forearm and wrist show plate and screw fixation of the comminuted distal left radial fracture in good position. No complicating features are seen. Some anatomic detail is obscured by overlying splint material. IMPRESSION: ORIF of distal left radial fracture. Electronically Signed   By: Dwyane Dee M.D.   On: 08/06/2017 16:18   Dg Wrist Complete Left  Result Date: 08/06/2017 CLINICAL DATA:  ORIF of left distal radial fracture EXAM: LEFT WRIST - COMPLETE 3+ VIEW COMPARISON:  Left wrist films of 08/05/2017 FINDINGS: Four C-arm spot films show placement of fixation plate and screws for stabilization of the comminuted distal left radial fracture now in near anatomic position. IMPRESSION: ORIF of comminuted distal left radial fracture. Electronically Signed   By: Dwyane Dee M.D.   On: 08/06/2017 16:16   Dg Wrist Complete Left  Result Date: 08/06/2017 CLINICAL DATA:   77 y/o  F; fall with wrist pain. EXAM: LEFT WRIST - COMPLETE 3+ VIEW COMPARISON:  None. FINDINGS: Acute comminuted intra-articular impacted fracture of the distal radius with mild dorsal apex angulation. Acute mildly displaced fracture of the ulnar styloid. Normal radiocarpal articulation. No additional fracture or dislocation identified. Bones are demineralized. Vascular calcifications noted. IMPRESSION: 1. Acute comminuted intra-articular impacted fracture of distal radius with  mild dorsal apex angulation. 2. Acute mildly displaced fracture of the ulnar styloid. Electronically Signed   By: Mitzi HansenLance  Furusawa-Stratton M.D.   On: 08/06/2017 01:07   Dg Tibia/fibula Left  Result Date: 08/06/2017 CLINICAL DATA:  77 y/o  F; fall with left hip pain. EXAM: DG HIP (WITH OR WITHOUT PELVIS) 2-3V LEFT; LEFT FEMUR 2 VIEWS; LEFT TIBIA AND FIBULA - 2 VIEW COMPARISON:  None. FINDINGS: Left hip: Partially visualized right femur intramedullary nail and proximal lag screw, no periprosthetic lucency or fracture identified. Hip joints are well maintained. No pelvic fracture or diastasis. Degenerative changes of the lower lumbar spine. Oblique overriding angulated and 1 shaft's with displaced fracture of the mid left femur diaphysis. Left femur: Acute oblique fracture of the mid left femur diaphysis with 5 cm overriding, mild dorsal and medial apex angulation, and 1 shaft's width posterior displacement of the distal fracture component. Knee joint is grossly maintained. Left tibia and fibula: No acute fracture or dislocation of the tibia and fibula. Partially visualized knee and ankle joints are grossly maintained. IMPRESSION: 1. Acute oblique fracture of the mid left femur diaphysis with 5 cm overriding, mild dorsal and medial apex angulation, and 1 shaft's width posterior displacement of the distal fracture component. 2. No additional fracture or dislocation identified. Electronically Signed   By: Mitzi HansenLance  Furusawa-Stratton M.D.   On:  08/06/2017 01:05   Ct Head Wo Contrast  Result Date: 08/06/2017 CLINICAL DATA:  Larey SeatFell down stairs. Femur fracture. Head trauma, headache. EXAM: CT HEAD WITHOUT CONTRAST TECHNIQUE: Contiguous axial images were obtained from the base of the skull through the vertex without intravenous contrast. COMPARISON:  None. FINDINGS: Brain: No evidence for acute infarction, hemorrhage, mass lesion, hydrocephalus, or extra-axial fluid. Generalized atrophy. Hypoattenuation of white matter, likely small vessel disease. Vascular: Calcification of the cavernous internal carotid arteries consistent with cerebrovascular atherosclerotic disease. No signs of intracranial large vessel occlusion. Skull: LEFT BAHA.  No skull fracture. Sinuses/Orbits: BILATERAL sinus opacity, affecting the maxillary, sphenoid, and ethmoid sinuses. No orbital findings of significance. Other: No middle ear or mastoid fluid. IMPRESSION: No skull fracture or intracranial hemorrhage. Atrophy and small vessel disease. LEFT bone assisted hearing device. Electronically Signed   By: Elsie StainJohn T Curnes M.D.   On: 08/06/2017 10:51   Dg C-arm 1-60 Min  Result Date: 08/06/2017 CLINICAL DATA:  ORIF of left wrist fracture EXAM: DG C-ARM 61-120 MIN COMPARISON:  Left wrist films of 08/05/2017 FINDINGS: C-arm fluoroscopy was provided during ORIF of the distal left radial fracture. Four C arm spot films were obtained. Fluoroscopy time of 29 seconds was recorded. IMPRESSION: Fluoroscopy provided during ORIF of distal left radial fracture. Electronically Signed   By: Dwyane DeePaul  Barry M.D.   On: 08/06/2017 16:15   Dg C-arm 1-60 Min  Result Date: 08/06/2017 CLINICAL DATA:  Left femur fracture EXAM: LEFT FEMUR 2 VIEWS; DG C-ARM 61-120 MIN COMPARISON:  08/06/2017 FLUOROSCOPY TIME:  Fluoroscopy Time:  3 minutes 8 seconds Radiation Exposure Index (if provided by the fluoroscopic device): Not available Number of Acquired Spot Images: 6 FINDINGS: Initial images again demonstrate the  midshaft femoral fracture. Medullary rod is noted with proximal and distal fixation screws. Fracture fragments are in anatomic alignment. IMPRESSION: ORIF of left femoral fracture. Electronically Signed   By: Alcide CleverMark  Lukens M.D.   On: 08/06/2017 15:14   Dg Hip Unilat With Pelvis 2-3 Views Left  Result Date: 08/06/2017 CLINICAL DATA:  77 y/o  F; fall with left hip pain. EXAM: DG HIP (WITH  OR WITHOUT PELVIS) 2-3V LEFT; LEFT FEMUR 2 VIEWS; LEFT TIBIA AND FIBULA - 2 VIEW COMPARISON:  None. FINDINGS: Left hip: Partially visualized right femur intramedullary nail and proximal lag screw, no periprosthetic lucency or fracture identified. Hip joints are well maintained. No pelvic fracture or diastasis. Degenerative changes of the lower lumbar spine. Oblique overriding angulated and 1 shaft's with displaced fracture of the mid left femur diaphysis. Left femur: Acute oblique fracture of the mid left femur diaphysis with 5 cm overriding, mild dorsal and medial apex angulation, and 1 shaft's width posterior displacement of the distal fracture component. Knee joint is grossly maintained. Left tibia and fibula: No acute fracture or dislocation of the tibia and fibula. Partially visualized knee and ankle joints are grossly maintained. IMPRESSION: 1. Acute oblique fracture of the mid left femur diaphysis with 5 cm overriding, mild dorsal and medial apex angulation, and 1 shaft's width posterior displacement of the distal fracture component. 2. No additional fracture or dislocation identified. Electronically Signed   By: Mitzi Hansen M.D.   On: 08/06/2017 01:05   Dg Femur Min 2 Views Left  Result Date: 08/06/2017 CLINICAL DATA:  Left femur fracture EXAM: LEFT FEMUR 2 VIEWS; DG C-ARM 61-120 MIN COMPARISON:  08/06/2017 FLUOROSCOPY TIME:  Fluoroscopy Time:  3 minutes 8 seconds Radiation Exposure Index (if provided by the fluoroscopic device): Not available Number of Acquired Spot Images: 6 FINDINGS: Initial images  again demonstrate the midshaft femoral fracture. Medullary rod is noted with proximal and distal fixation screws. Fracture fragments are in anatomic alignment. IMPRESSION: ORIF of left femoral fracture. Electronically Signed   By: Alcide Clever M.D.   On: 08/06/2017 15:14   Dg Femur Min 2 Views Left  Result Date: 08/06/2017 CLINICAL DATA:  77 y/o  F; fall with left hip pain. EXAM: DG HIP (WITH OR WITHOUT PELVIS) 2-3V LEFT; LEFT FEMUR 2 VIEWS; LEFT TIBIA AND FIBULA - 2 VIEW COMPARISON:  None. FINDINGS: Left hip: Partially visualized right femur intramedullary nail and proximal lag screw, no periprosthetic lucency or fracture identified. Hip joints are well maintained. No pelvic fracture or diastasis. Degenerative changes of the lower lumbar spine. Oblique overriding angulated and 1 shaft's with displaced fracture of the mid left femur diaphysis. Left femur: Acute oblique fracture of the mid left femur diaphysis with 5 cm overriding, mild dorsal and medial apex angulation, and 1 shaft's width posterior displacement of the distal fracture component. Knee joint is grossly maintained. Left tibia and fibula: No acute fracture or dislocation of the tibia and fibula. Partially visualized knee and ankle joints are grossly maintained. IMPRESSION: 1. Acute oblique fracture of the mid left femur diaphysis with 5 cm overriding, mild dorsal and medial apex angulation, and 1 shaft's width posterior displacement of the distal fracture component. 2. No additional fracture or dislocation identified. Electronically Signed   By: Mitzi Hansen M.D.   On: 08/06/2017 01:05   Dg Femur Port Min 2 Views Left  Result Date: 08/06/2017 CLINICAL DATA:  Postop IM nail fixation of left femoral fracture EXAM: LEFT FEMUR PORTABLE 2 VIEWS COMPARISON:  Left femur films of 08/06/2016 FINDINGS: IM nail has been placed for fixation of the mid left femoral fracture in good position and alignment. No complicating features are seen.  IMPRESSION: IM nail fixation of mid left femoral fracture in good position and alignment. Electronically Signed   By: Dwyane Dee M.D.   On: 08/06/2017 16:17    Microbiology: Recent Results (from the past 240 hour(s))  Surgical  pcr screen     Status: None   Collection Time: 08/06/17 10:35 AM  Result Value Ref Range Status   MRSA, PCR NEGATIVE NEGATIVE Final   Staphylococcus aureus NEGATIVE NEGATIVE Final    Comment: (NOTE) The Xpert SA Assay (FDA approved for NASAL specimens in patients 13 years of age and older), is one component of a comprehensive surveillance program. It is not intended to diagnose infection nor to guide or monitor treatment. Performed at [facility], [address], [place], [place] [address]      Labs: Basic Metabolic Panel: Recent Labs  Lab 08/05/17 2318 08/06/17 0430  NA 139 137  K 4.1 4.0  CL 101 104  CO2 27 24  GLUCOSE 153* 188*  BUN 13 14  CREATININE 0.96 0.94  CALCIUM 9.2 8.5*   Liver Function Tests: No results for input(s): AST, ALT, ALKPHOS, BILITOT, PROT, ALBUMIN in the last 168 hours. No results for input(s): LIPASE, AMYLASE in the last 168 hours. No results for input(s): AMMONIA in the last 168 hours. CBC: Recent Labs  Lab 08/05/17 2318 08/06/17 0430 08/07/17 0501 08/08/17 1201  WBC 12.6* 15.5* 12.5* 15.3*  NEUTROABS 8.9*  --   --   --   HGB 10.9* 10.9* 8.9* 9.4*  HCT 35.1* 34.7* 28.7* 32.3*  MCV 101.4* 100.9* 101.8* 109.9*  PLT 214 193 199 214   Cardiac Enzymes: No results for input(s): CKTOTAL, CKMB, CKMBINDEX, TROPONINI in the last 168 hours. BNP: BNP (last 3 results) No results for input(s): BNP in the last 8760 hours.  ProBNP (last 3 results) No results for input(s): PROBNP in the last 8760 hours.  CBG: Recent Labs  Lab 08/07/17 1203 08/07/17 1612 08/07/17 2139 08/08/17 0708 08/08/17 1124  GLUCAP 210* 189* 217* 92 108*       Signed:  Hollice Espy, MD [facility] 08/08/2017, 2:13  PM

## 2017-08-08 NOTE — Progress Notes (Signed)
Orthopaedic Trauma Progress Note  S: Worked with therapy. No major issues this AM  O:  Vitals:   08/07/17 2003 08/08/17 0538  BP: (!) 107/41 (!) 138/96  Pulse: 87 83  Resp: 17 18  Temp: 98.5 F (36.9 C) 97.7 F (36.5 C)  SpO2: 90% 93%   Gen: NAD LUE: Splint in place, clean, dry and intact. +Medial/radial/ulnar nerve motor and sensory function. Warm and well perfused fingers  LLE: Dressings clean, dry and intact. Taken down, incisions clean, dry and intact +EHL/TA/GSC. Sensation intact to dorsum and plantar aspect of foot. Warm and well perfused foot  Imaging: Stable post op imaging  Labs:  Results for orders placed or performed during the hospital encounter of 08/05/17 (from the past 24 hour(s))  Glucose, capillary     Status: Abnormal   Collection Time: 08/07/17 12:03 PM  Result Value Ref Range   Glucose-Capillary 210 (H) 70 - 99 mg/dL  Glucose, capillary     Status: Abnormal   Collection Time: 08/07/17  4:12 PM  Result Value Ref Range   Glucose-Capillary 189 (H) 70 - 99 mg/dL  Glucose, capillary     Status: Abnormal   Collection Time: 08/07/17  9:39 PM  Result Value Ref Range   Glucose-Capillary 217 (H) 70 - 99 mg/dL  Glucose, capillary     Status: None   Collection Time: 08/08/17  7:08 AM  Result Value Ref Range   Glucose-Capillary 92 70 - 99 mg/dL    Assessment: 77 year old female hx NIDDM, CAD, hypothryoidism s/p fall  Injuries: 1. Left femur fracture s/p IMN 2. ORIF of left distal radius fracture  Weightbearing: WBAT LLE, NWB LUE, okay for WB thru elbow for platform walker  Insicional and dressing care: Splint to LUE until follow up, Adaptac and 4x4s with ACE wrap to lower leg, and upper thigh okay to leave open to air  Orthopedic device(s):Splint to LUE  CV/Blood loss:Acute blood loss anemia 10.9-->8.9 no new labs this AM  Pain management: Tramadol PRN  VTE prophylaxis: Lovenox 40mg   ID: Ancef for 24 hrs-completed  Foley/Lines: Foley D/C'd,  medlocked  Impediments to Fracture Healing: Osteoporosis, hypothyroidism  Dispo: PT and OT eval and likely HH PT  Follow - up plan: Follow up 2-3 weeks   Roby LoftsKevin P. Trajon Rosete, MD [facility] [phone] (phone)

## 2017-08-08 NOTE — Progress Notes (Signed)
Physical Therapy Treatment Patient Details Name: Crystal Pham MRN: [id number] DOB: 08/19/1940 Today's Date: 08/08/2017    History of Present Illness Patient is a 77 y/o female admitted after fall, s/p IM nail L femur and ORIF L distal radius fx. PMH signficant for but not limited to HTN, DM, GERD, CAD.     PT Comments    Pt making fair progress with functional mobility. She tolerated short distance ambulation within her room with L PFRW and min A for stability and safety. PT continuing to recommend ST rehab at a SNF prior to returning home with family; however, pt and family are very adamant about her returning back home. Pt would continue to benefit from skilled physical therapy services at this time while admitted and after d/c to address the below listed limitations in order to improve overall safety and independence with functional mobility.   Follow Up Recommendations  SNF;Other (comment)(pt and family are adamant about returning home)     Equipment Recommendations  Other (comment)(L platform attachment - pt has a RW, just need the platform)    Recommendations for Other Services       Precautions / Restrictions Precautions Precautions: Fall Required Braces or Orthoses: Sling;Other Brace/Splint Other Brace/Splint: splinted distal L UE  Restrictions Weight Bearing Restrictions: Yes LUE Weight Bearing: Weight bear through elbow only LLE Weight Bearing: Weight bearing as tolerated    Mobility  Bed Mobility               General bed mobility comments: pt sitting EOB with OT present upon arrival  Transfers Overall transfer level: Needs assistance Equipment used: Left platform walker Transfers: Sit to/from Stand Sit to Stand: Max assist         General transfer comment: increased time and effort, cueing for technique and safety, max A to achieve full upright standing from EOB  Ambulation/Gait Ambulation/Gait assistance: Min assist Gait Distance (Feet): 7  Feet Assistive device: Left platform walker Gait Pattern/deviations: Step-to pattern;Decreased step length - right;Decreased stance time - left;Decreased stride length;Decreased weight shift to left;Antalgic Gait velocity: decreased Gait velocity interpretation: <1.31 ft/sec, indicative of household ambulator General Gait Details: pt with mild instability, requiring constant min A and assist needed with advancing L PFRW forwards as well, cueing for sequencing   Stairs             Wheelchair Mobility    Modified Rankin (Stroke Patients Only)       Balance Overall balance assessment: Needs assistance Sitting-balance support: Feet supported;No upper extremity supported Sitting balance-Leahy Scale: Fair     Standing balance support: Single extremity supported;During functional activity;Bilateral upper extremity supported Standing balance-Leahy Scale: Poor                              Cognition Arousal/Alertness: Awake/alert Behavior During Therapy: WFL for tasks assessed/performed Overall Cognitive Status: Impaired/Different from baseline Area of Impairment: Problem solving;Memory                     Memory: Decreased short-term memory;Decreased recall of precautions       Problem Solving: Difficulty sequencing;Requires verbal cues;Requires tactile cues        Exercises      General Comments        Pertinent Vitals/Pain Pain Assessment: Faces Faces Pain Scale: Hurts a little bit Pain Location: L UE Pain Descriptors / Indicators: Sore Pain Intervention(s): Monitored during session;Repositioned  Home Living                      Prior Function            PT Goals (current goals can now be found in the care plan section) Acute Rehab PT Goals PT Goal Formulation: With patient/family Time For Goal Achievement: 08/21/17 Potential to Achieve Goals: Good Progress towards PT goals: Progressing toward goals    Frequency     Min 3X/week      PT Plan Current plan remains appropriate    Co-evaluation              AM-PAC PT "6 Clicks" Daily Activity  Outcome Measure  Difficulty turning over in bed (including adjusting bedclothes, sheets and blankets)?: Unable Difficulty moving from lying on back to sitting on the side of the bed? : Unable Difficulty sitting down on and standing up from a chair with arms (e.g., wheelchair, bedside commode, etc,.)?: Unable Help needed moving to and from a bed to chair (including a wheelchair)?: A Little Help needed walking in hospital room?: A Little Help needed climbing 3-5 steps with a railing? : Total 6 Click Score: 10    End of Session Equipment Utilized During Treatment: Gait belt Activity Tolerance: Patient limited by fatigue;Patient limited by pain Patient left: in chair;with call bell/phone within reach;Other (comment)(RN present) Nurse Communication: Mobility status;Other (comment)(pt bleeding from IV site) PT Visit Diagnosis: Other abnormalities of gait and mobility (R26.89);Muscle weakness (generalized) (M62.81);Pain Pain - Right/Left: Left Pain - part of body: Arm;Leg     Time: [id number] PT Time Calculation (min) (ACUTE ONLY): 16 min  Charges:  $Gait Training: 8-22 mins                    G Codes:       AuroraJennifer Garan Frappier, [place]PT, [place] [phone]    Alessandra BevelsJennifer M Danicka Hourihan 08/08/2017, 11:55 AM

## 2018-05-10 ENCOUNTER — Other Ambulatory Visit: Payer: Self-pay

## 2018-05-10 NOTE — Patient Outreach (Signed)
[facility]) Care Management  05/10/2018  Crystal Pham Mar 05, 1940 [id number]   Medication Adherence call to Crystal Pham  Patient's telephone number is disconnected  Crystal Pham is past due on Glimepiride 1 mg and Losartan 50 mg under [employer].   Lillia Abed CPhT Pharmacy Technician [facility] Management Direct Dial [phone]  Fax [phone] [email or web address]

## 2019-02-17 DIAGNOSIS — Z9981 Dependence on supplemental oxygen: Secondary | ICD-10-CM | POA: Diagnosis not present

## 2019-02-17 DIAGNOSIS — J449 Chronic obstructive pulmonary disease, unspecified: Secondary | ICD-10-CM | POA: Diagnosis not present

## 2019-02-17 DIAGNOSIS — M0579 Rheumatoid arthritis with rheumatoid factor of multiple sites without organ or systems involvement: Secondary | ICD-10-CM | POA: Diagnosis not present

## 2019-02-17 DIAGNOSIS — E785 Hyperlipidemia, unspecified: Secondary | ICD-10-CM | POA: Diagnosis not present

## 2019-02-17 DIAGNOSIS — E1169 Type 2 diabetes mellitus with other specified complication: Secondary | ICD-10-CM | POA: Diagnosis not present

## 2019-02-17 DIAGNOSIS — E039 Hypothyroidism, unspecified: Secondary | ICD-10-CM | POA: Diagnosis not present

## 2019-02-17 DIAGNOSIS — J9611 Chronic respiratory failure with hypoxia: Secondary | ICD-10-CM | POA: Diagnosis not present

## 2019-02-17 DIAGNOSIS — Z79899 Other long term (current) drug therapy: Secondary | ICD-10-CM | POA: Diagnosis not present

## 2019-02-17 DIAGNOSIS — E7800 Pure hypercholesterolemia, unspecified: Secondary | ICD-10-CM | POA: Diagnosis not present

## 2019-02-18 DIAGNOSIS — G4734 Idiopathic sleep related nonobstructive alveolar hypoventilation: Secondary | ICD-10-CM | POA: Diagnosis not present

## 2019-02-18 DIAGNOSIS — M810 Age-related osteoporosis without current pathological fracture: Secondary | ICD-10-CM | POA: Diagnosis not present

## 2019-02-18 DIAGNOSIS — R0902 Hypoxemia: Secondary | ICD-10-CM | POA: Diagnosis not present

## 2019-02-21 DIAGNOSIS — J929 Pleural plaque without asbestos: Secondary | ICD-10-CM | POA: Diagnosis not present

## 2019-02-21 DIAGNOSIS — M069 Rheumatoid arthritis, unspecified: Secondary | ICD-10-CM | POA: Diagnosis not present

## 2019-02-21 DIAGNOSIS — N179 Acute kidney failure, unspecified: Secondary | ICD-10-CM | POA: Diagnosis not present

## 2019-02-21 DIAGNOSIS — E119 Type 2 diabetes mellitus without complications: Secondary | ICD-10-CM | POA: Diagnosis not present

## 2019-02-21 DIAGNOSIS — K7460 Unspecified cirrhosis of liver: Secondary | ICD-10-CM | POA: Diagnosis not present

## 2019-02-21 DIAGNOSIS — R197 Diarrhea, unspecified: Secondary | ICD-10-CM | POA: Diagnosis not present

## 2019-02-21 DIAGNOSIS — I63522 Cerebral infarction due to unspecified occlusion or stenosis of left anterior cerebral artery: Secondary | ICD-10-CM | POA: Diagnosis not present

## 2019-02-21 DIAGNOSIS — R0602 Shortness of breath: Secondary | ICD-10-CM | POA: Diagnosis not present

## 2019-02-21 DIAGNOSIS — J449 Chronic obstructive pulmonary disease, unspecified: Secondary | ICD-10-CM | POA: Diagnosis not present

## 2019-02-21 DIAGNOSIS — I10 Essential (primary) hypertension: Secondary | ICD-10-CM | POA: Diagnosis not present

## 2019-02-21 DIAGNOSIS — M549 Dorsalgia, unspecified: Secondary | ICD-10-CM | POA: Diagnosis not present

## 2019-02-21 DIAGNOSIS — R4182 Altered mental status, unspecified: Secondary | ICD-10-CM | POA: Diagnosis not present

## 2019-02-22 DIAGNOSIS — I63522 Cerebral infarction due to unspecified occlusion or stenosis of left anterior cerebral artery: Secondary | ICD-10-CM | POA: Diagnosis not present

## 2019-02-22 DIAGNOSIS — R4182 Altered mental status, unspecified: Secondary | ICD-10-CM | POA: Diagnosis not present

## 2019-02-22 DIAGNOSIS — R0602 Shortness of breath: Secondary | ICD-10-CM | POA: Diagnosis not present

## 2019-02-22 DIAGNOSIS — J929 Pleural plaque without asbestos: Secondary | ICD-10-CM | POA: Diagnosis not present

## 2019-02-25 DIAGNOSIS — J9611 Chronic respiratory failure with hypoxia: Secondary | ICD-10-CM | POA: Diagnosis not present

## 2019-02-25 DIAGNOSIS — E7800 Pure hypercholesterolemia, unspecified: Secondary | ICD-10-CM | POA: Diagnosis not present

## 2019-02-25 DIAGNOSIS — J449 Chronic obstructive pulmonary disease, unspecified: Secondary | ICD-10-CM | POA: Diagnosis not present

## 2019-02-25 DIAGNOSIS — Z6828 Body mass index (BMI) 28.0-28.9, adult: Secondary | ICD-10-CM | POA: Diagnosis not present

## 2019-02-25 DIAGNOSIS — E1169 Type 2 diabetes mellitus with other specified complication: Secondary | ICD-10-CM | POA: Diagnosis not present

## 2019-02-25 DIAGNOSIS — Z9981 Dependence on supplemental oxygen: Secondary | ICD-10-CM | POA: Diagnosis not present

## 2019-02-25 DIAGNOSIS — I10 Essential (primary) hypertension: Secondary | ICD-10-CM | POA: Diagnosis not present

## 2019-02-25 DIAGNOSIS — E663 Overweight: Secondary | ICD-10-CM | POA: Diagnosis not present

## 2019-03-14 DIAGNOSIS — R29818 Other symptoms and signs involving the nervous system: Secondary | ICD-10-CM | POA: Diagnosis not present

## 2019-03-14 DIAGNOSIS — M159 Polyosteoarthritis, unspecified: Secondary | ICD-10-CM | POA: Diagnosis not present

## 2019-03-14 DIAGNOSIS — E876 Hypokalemia: Secondary | ICD-10-CM | POA: Diagnosis not present

## 2019-03-14 DIAGNOSIS — E7800 Pure hypercholesterolemia, unspecified: Secondary | ICD-10-CM | POA: Diagnosis not present

## 2019-03-14 DIAGNOSIS — M62838 Other muscle spasm: Secondary | ICD-10-CM | POA: Diagnosis not present

## 2019-03-14 DIAGNOSIS — M7581 Other shoulder lesions, right shoulder: Secondary | ICD-10-CM | POA: Diagnosis not present

## 2019-03-14 DIAGNOSIS — E1169 Type 2 diabetes mellitus with other specified complication: Secondary | ICD-10-CM | POA: Diagnosis not present

## 2019-03-14 DIAGNOSIS — Z6829 Body mass index (BMI) 29.0-29.9, adult: Secondary | ICD-10-CM | POA: Diagnosis not present

## 2019-03-14 DIAGNOSIS — E663 Overweight: Secondary | ICD-10-CM | POA: Diagnosis not present

## 2019-03-21 DIAGNOSIS — M810 Age-related osteoporosis without current pathological fracture: Secondary | ICD-10-CM | POA: Diagnosis not present

## 2019-03-21 DIAGNOSIS — G4734 Idiopathic sleep related nonobstructive alveolar hypoventilation: Secondary | ICD-10-CM | POA: Diagnosis not present

## 2019-03-21 DIAGNOSIS — R0902 Hypoxemia: Secondary | ICD-10-CM | POA: Diagnosis not present

## 2019-03-29 DIAGNOSIS — M0609 Rheumatoid arthritis without rheumatoid factor, multiple sites: Secondary | ICD-10-CM | POA: Diagnosis not present

## 2019-04-14 DIAGNOSIS — E1169 Type 2 diabetes mellitus with other specified complication: Secondary | ICD-10-CM | POA: Diagnosis not present

## 2019-04-18 DIAGNOSIS — E1169 Type 2 diabetes mellitus with other specified complication: Secondary | ICD-10-CM | POA: Diagnosis not present

## 2019-04-18 DIAGNOSIS — E785 Hyperlipidemia, unspecified: Secondary | ICD-10-CM | POA: Diagnosis not present

## 2019-04-18 DIAGNOSIS — E1122 Type 2 diabetes mellitus with diabetic chronic kidney disease: Secondary | ICD-10-CM | POA: Diagnosis not present

## 2019-04-18 DIAGNOSIS — M810 Age-related osteoporosis without current pathological fracture: Secondary | ICD-10-CM | POA: Diagnosis not present

## 2019-04-18 DIAGNOSIS — G4734 Idiopathic sleep related nonobstructive alveolar hypoventilation: Secondary | ICD-10-CM | POA: Diagnosis not present

## 2019-04-18 DIAGNOSIS — R0902 Hypoxemia: Secondary | ICD-10-CM | POA: Diagnosis not present

## 2019-04-18 DIAGNOSIS — E7800 Pure hypercholesterolemia, unspecified: Secondary | ICD-10-CM | POA: Diagnosis not present

## 2019-04-26 DIAGNOSIS — M810 Age-related osteoporosis without current pathological fracture: Secondary | ICD-10-CM | POA: Diagnosis not present

## 2019-04-26 DIAGNOSIS — M0609 Rheumatoid arthritis without rheumatoid factor, multiple sites: Secondary | ICD-10-CM | POA: Diagnosis not present

## 2019-04-26 DIAGNOSIS — Z79899 Other long term (current) drug therapy: Secondary | ICD-10-CM | POA: Diagnosis not present

## 2019-05-17 DIAGNOSIS — C4442 Squamous cell carcinoma of skin of scalp and neck: Secondary | ICD-10-CM | POA: Diagnosis not present

## 2019-05-19 DIAGNOSIS — M810 Age-related osteoporosis without current pathological fracture: Secondary | ICD-10-CM | POA: Diagnosis not present

## 2019-05-19 DIAGNOSIS — G4734 Idiopathic sleep related nonobstructive alveolar hypoventilation: Secondary | ICD-10-CM | POA: Diagnosis not present

## 2019-05-19 DIAGNOSIS — R0902 Hypoxemia: Secondary | ICD-10-CM | POA: Diagnosis not present

## 2019-05-23 DIAGNOSIS — M810 Age-related osteoporosis without current pathological fracture: Secondary | ICD-10-CM | POA: Diagnosis not present

## 2019-05-25 DIAGNOSIS — M0609 Rheumatoid arthritis without rheumatoid factor, multiple sites: Secondary | ICD-10-CM | POA: Diagnosis not present

## 2019-06-03 DIAGNOSIS — S0085XA Superficial foreign body of other part of head, initial encounter: Secondary | ICD-10-CM | POA: Diagnosis not present

## 2019-06-09 DIAGNOSIS — S0085XA Superficial foreign body of other part of head, initial encounter: Secondary | ICD-10-CM | POA: Diagnosis not present

## 2019-06-18 DIAGNOSIS — G4734 Idiopathic sleep related nonobstructive alveolar hypoventilation: Secondary | ICD-10-CM | POA: Diagnosis not present

## 2019-06-18 DIAGNOSIS — R0902 Hypoxemia: Secondary | ICD-10-CM | POA: Diagnosis not present

## 2019-06-18 DIAGNOSIS — M810 Age-related osteoporosis without current pathological fracture: Secondary | ICD-10-CM | POA: Diagnosis not present

## 2019-06-19 DIAGNOSIS — K7460 Unspecified cirrhosis of liver: Secondary | ICD-10-CM | POA: Diagnosis not present

## 2019-06-19 DIAGNOSIS — M069 Rheumatoid arthritis, unspecified: Secondary | ICD-10-CM | POA: Diagnosis not present

## 2019-06-19 DIAGNOSIS — R079 Chest pain, unspecified: Secondary | ICD-10-CM | POA: Diagnosis not present

## 2019-06-19 DIAGNOSIS — I2510 Atherosclerotic heart disease of native coronary artery without angina pectoris: Secondary | ICD-10-CM | POA: Diagnosis not present

## 2019-06-19 DIAGNOSIS — I252 Old myocardial infarction: Secondary | ICD-10-CM | POA: Diagnosis not present

## 2019-06-19 DIAGNOSIS — E119 Type 2 diabetes mellitus without complications: Secondary | ICD-10-CM | POA: Diagnosis not present

## 2019-06-19 DIAGNOSIS — J449 Chronic obstructive pulmonary disease, unspecified: Secondary | ICD-10-CM | POA: Diagnosis not present

## 2019-06-19 DIAGNOSIS — Z743 Need for continuous supervision: Secondary | ICD-10-CM | POA: Diagnosis not present

## 2019-06-19 DIAGNOSIS — I959 Hypotension, unspecified: Secondary | ICD-10-CM | POA: Diagnosis not present

## 2019-06-19 DIAGNOSIS — I517 Cardiomegaly: Secondary | ICD-10-CM | POA: Diagnosis not present

## 2019-06-19 DIAGNOSIS — M546 Pain in thoracic spine: Secondary | ICD-10-CM | POA: Diagnosis not present

## 2019-06-19 DIAGNOSIS — R0902 Hypoxemia: Secondary | ICD-10-CM | POA: Diagnosis not present

## 2019-06-19 DIAGNOSIS — R05 Cough: Secondary | ICD-10-CM | POA: Diagnosis not present

## 2019-06-19 DIAGNOSIS — M5489 Other dorsalgia: Secondary | ICD-10-CM | POA: Diagnosis not present

## 2019-06-19 DIAGNOSIS — E7800 Pure hypercholesterolemia, unspecified: Secondary | ICD-10-CM | POA: Diagnosis not present

## 2019-06-19 DIAGNOSIS — D509 Iron deficiency anemia, unspecified: Secondary | ICD-10-CM | POA: Diagnosis not present

## 2019-06-22 DIAGNOSIS — M0609 Rheumatoid arthritis without rheumatoid factor, multiple sites: Secondary | ICD-10-CM | POA: Diagnosis not present

## 2019-06-28 DIAGNOSIS — L570 Actinic keratosis: Secondary | ICD-10-CM | POA: Diagnosis not present

## 2019-06-28 DIAGNOSIS — L638 Other alopecia areata: Secondary | ICD-10-CM | POA: Diagnosis not present

## 2019-06-28 DIAGNOSIS — L821 Other seborrheic keratosis: Secondary | ICD-10-CM | POA: Diagnosis not present

## 2019-06-28 DIAGNOSIS — L820 Inflamed seborrheic keratosis: Secondary | ICD-10-CM | POA: Diagnosis not present

## 2019-06-30 DIAGNOSIS — Z79899 Other long term (current) drug therapy: Secondary | ICD-10-CM | POA: Diagnosis not present

## 2019-06-30 DIAGNOSIS — M0609 Rheumatoid arthritis without rheumatoid factor, multiple sites: Secondary | ICD-10-CM | POA: Diagnosis not present

## 2019-06-30 DIAGNOSIS — M810 Age-related osteoporosis without current pathological fracture: Secondary | ICD-10-CM | POA: Diagnosis not present

## 2019-07-06 DIAGNOSIS — E1169 Type 2 diabetes mellitus with other specified complication: Secondary | ICD-10-CM | POA: Diagnosis not present

## 2019-07-06 DIAGNOSIS — Z79899 Other long term (current) drug therapy: Secondary | ICD-10-CM | POA: Diagnosis not present

## 2019-07-06 DIAGNOSIS — E039 Hypothyroidism, unspecified: Secondary | ICD-10-CM | POA: Diagnosis not present

## 2019-07-06 DIAGNOSIS — E785 Hyperlipidemia, unspecified: Secondary | ICD-10-CM | POA: Diagnosis not present

## 2019-07-14 DIAGNOSIS — M791 Myalgia, unspecified site: Secondary | ICD-10-CM | POA: Diagnosis not present

## 2019-07-14 DIAGNOSIS — Z0000 Encounter for general adult medical examination without abnormal findings: Secondary | ICD-10-CM | POA: Diagnosis not present

## 2019-07-14 DIAGNOSIS — E785 Hyperlipidemia, unspecified: Secondary | ICD-10-CM | POA: Diagnosis not present

## 2019-07-14 DIAGNOSIS — J449 Chronic obstructive pulmonary disease, unspecified: Secondary | ICD-10-CM | POA: Diagnosis not present

## 2019-07-14 DIAGNOSIS — G4734 Idiopathic sleep related nonobstructive alveolar hypoventilation: Secondary | ICD-10-CM | POA: Diagnosis not present

## 2019-07-14 DIAGNOSIS — E1122 Type 2 diabetes mellitus with diabetic chronic kidney disease: Secondary | ICD-10-CM | POA: Diagnosis not present

## 2019-07-14 DIAGNOSIS — N183 Chronic kidney disease, stage 3 unspecified: Secondary | ICD-10-CM | POA: Diagnosis not present

## 2019-07-14 DIAGNOSIS — E1169 Type 2 diabetes mellitus with other specified complication: Secondary | ICD-10-CM | POA: Diagnosis not present

## 2019-07-14 DIAGNOSIS — E7800 Pure hypercholesterolemia, unspecified: Secondary | ICD-10-CM | POA: Diagnosis not present

## 2019-07-16 IMAGING — RF DG WRIST COMPLETE 3+V*L*
1 series · 1 of 1 positions shown · non-contrast
Comparison: Left wrist films of 08/05/2017

CLINICAL DATA: ORIF of left distal radial fracture

EXAM:
LEFT WRIST - COMPLETE 3+ VIEW

[Series 1: run · 1 of 1 slices shown]
[im 1/1]
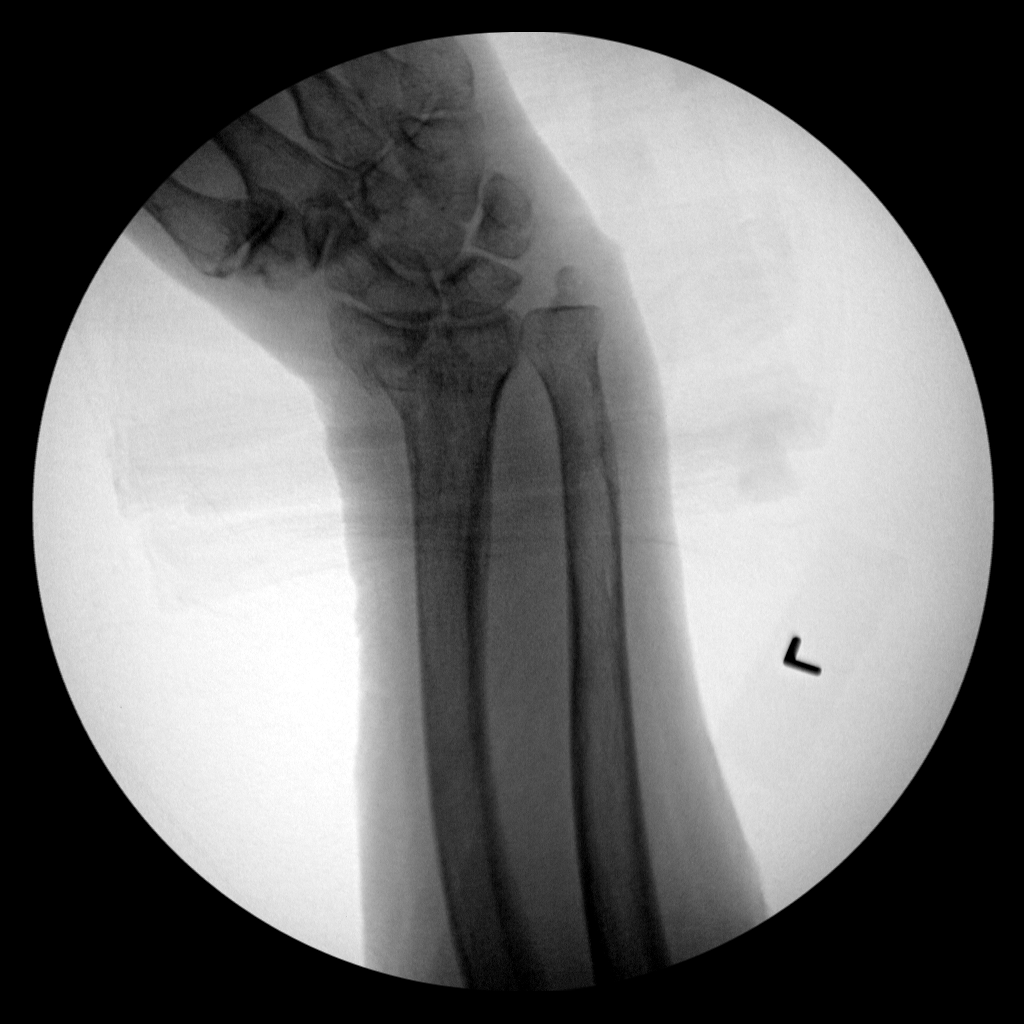

[1 of 1 positions shown; findings below may reference images not displayed]

FINDINGS: Four C-arm spot films show placement of fixation plate and screws
for stabilization of the comminuted distal left radial fracture now
in near anatomic position.
IMPRESSION: ORIF of comminuted distal left radial fracture.

## 2019-07-16 IMAGING — CR DG FEMUR 2+V PORT*L*
4 series · 4 of 4 positions shown · non-contrast
Comparison: Left femur films of 08/06/2016

CLINICAL DATA: Postop IM nail fixation of left femoral fracture

EXAM:
LEFT FEMUR PORTABLE 2 VIEWS

[AP (1 of 2)]
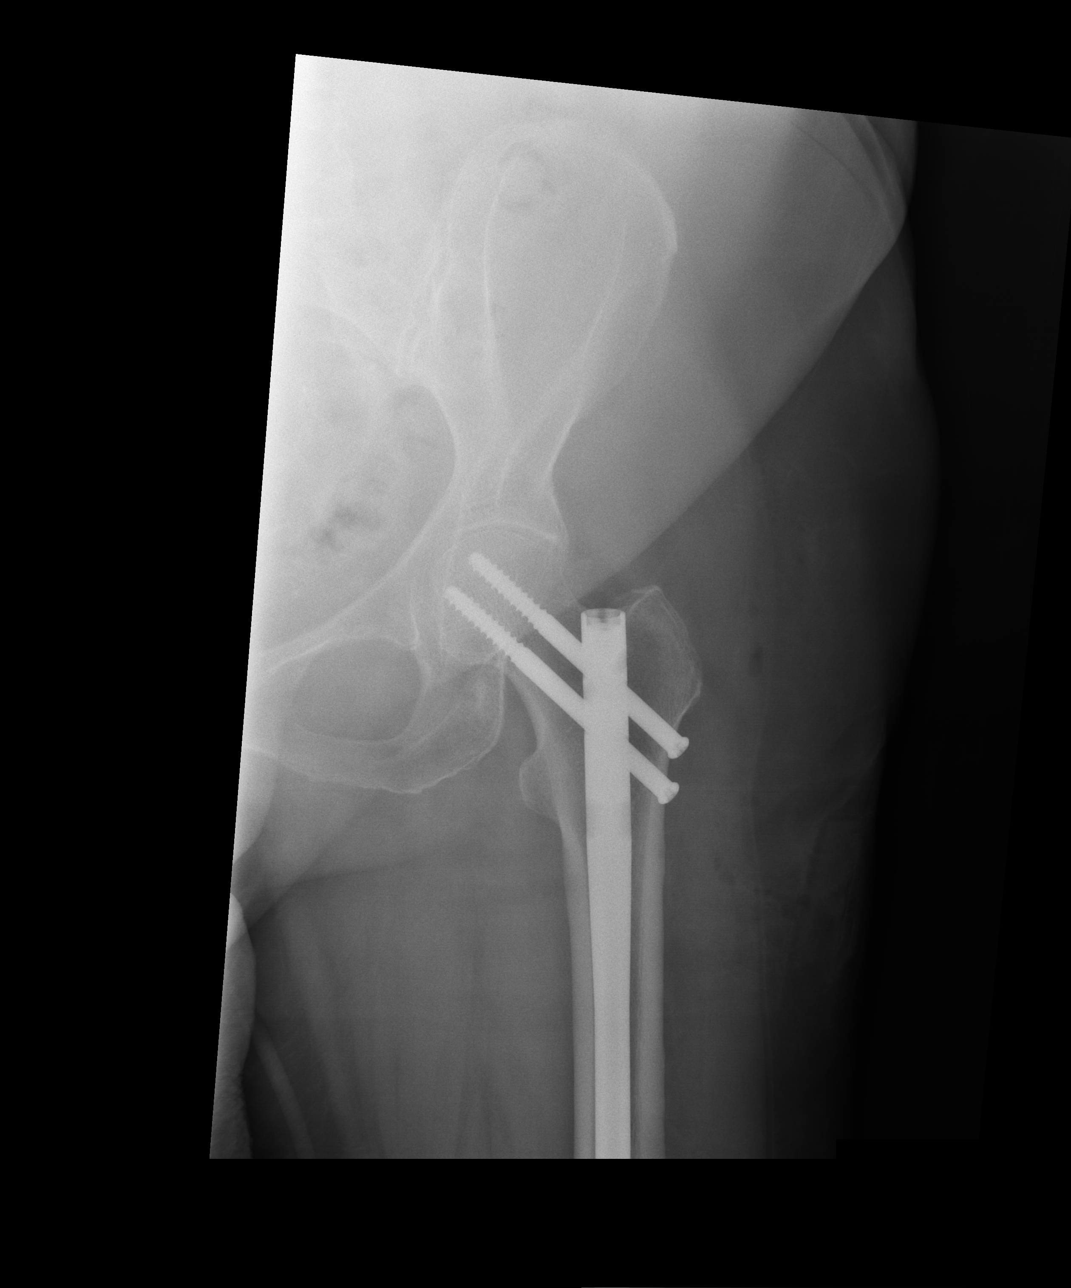

[AP (2 of 2)]
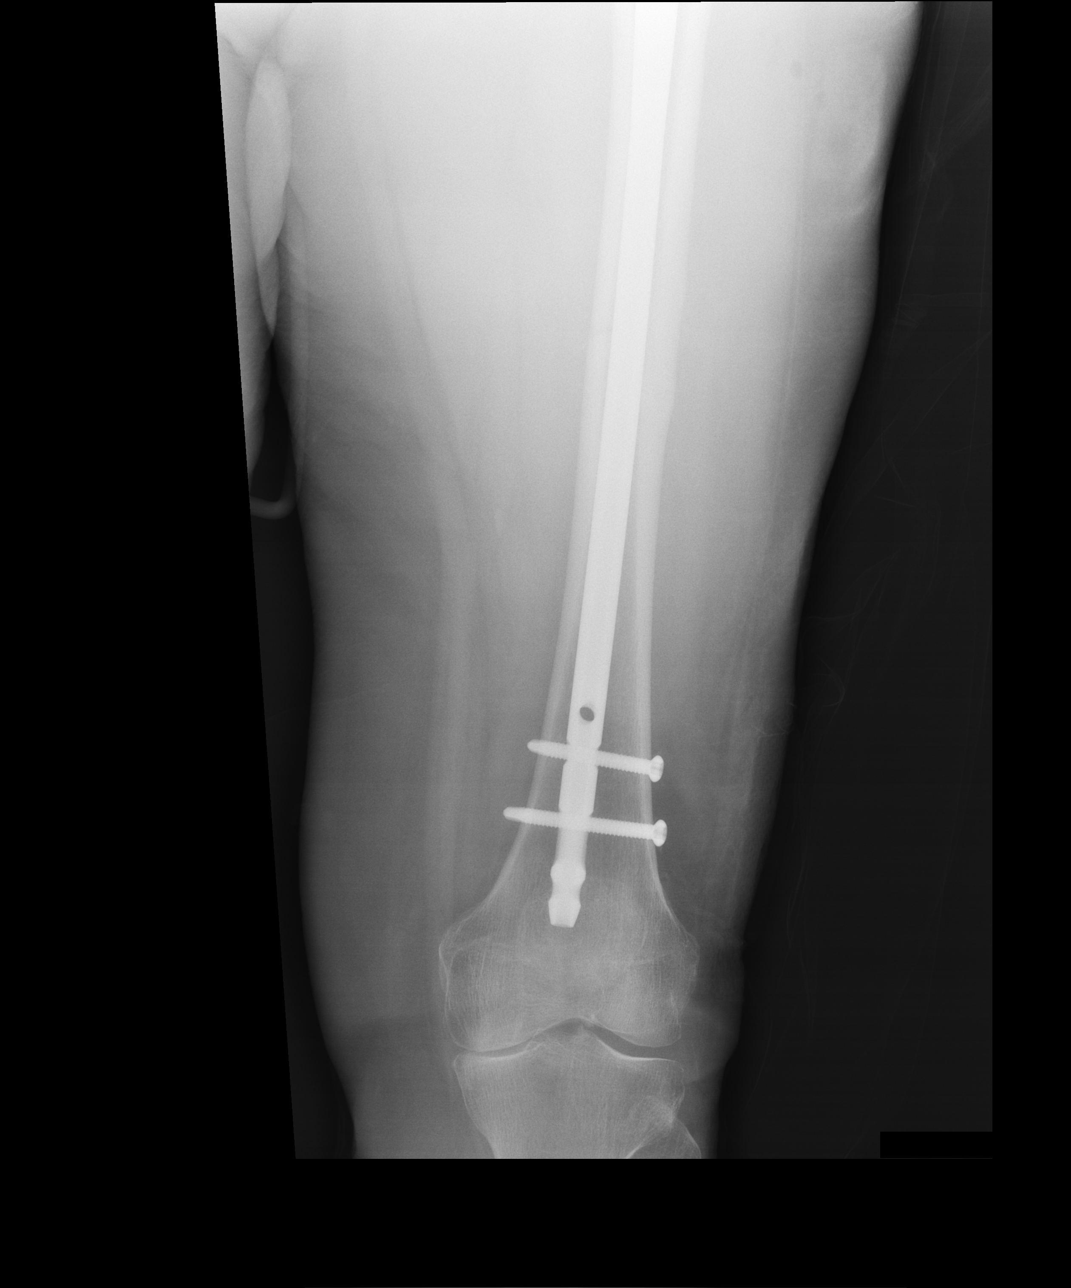

[lateral]
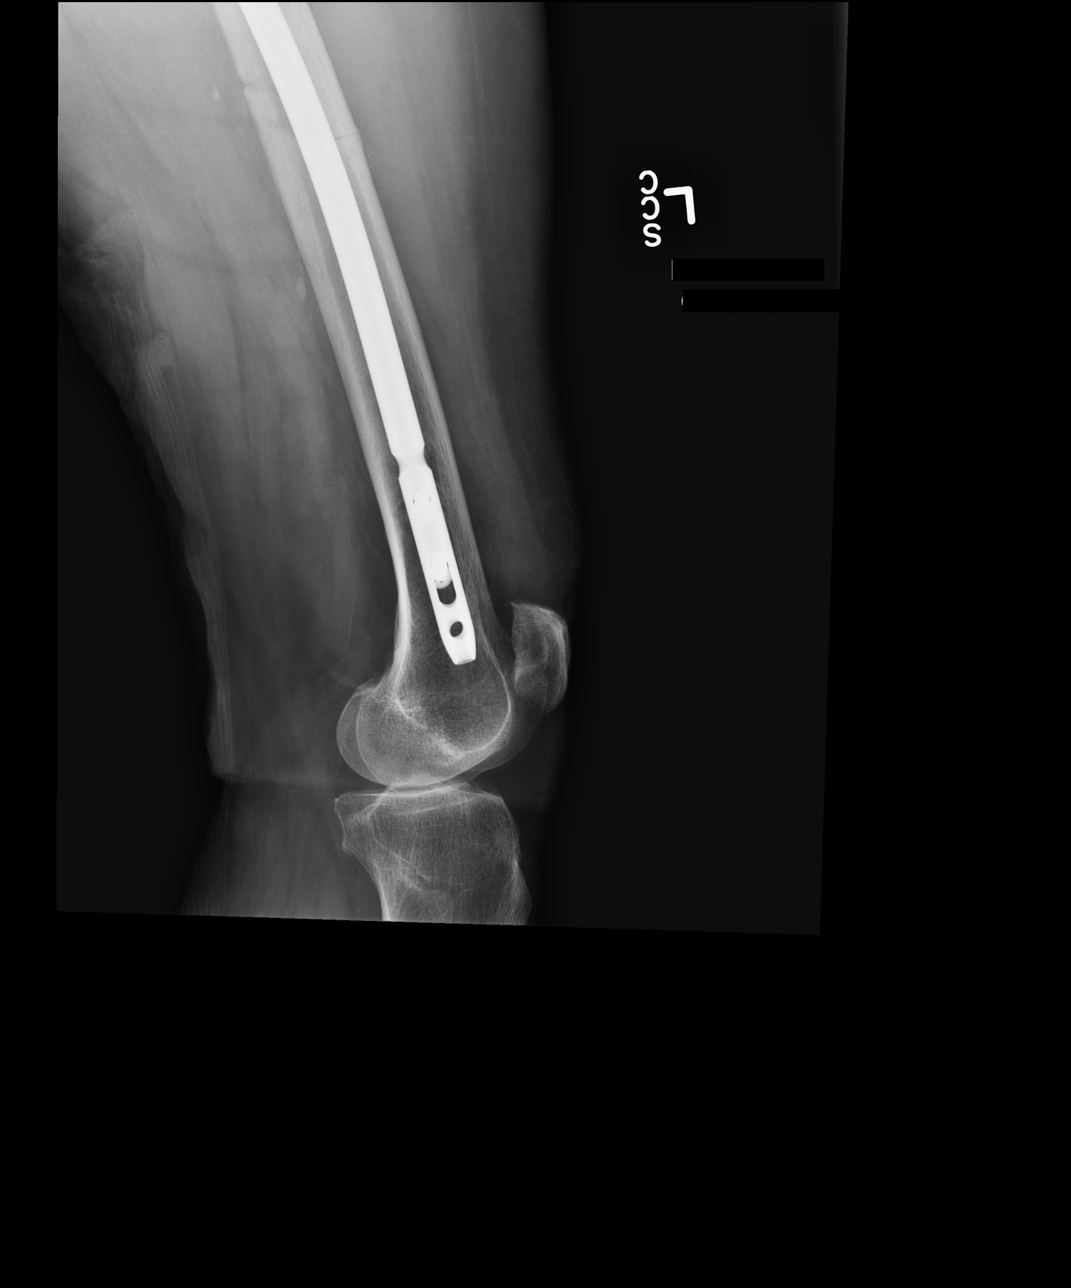

[xtable lateral]
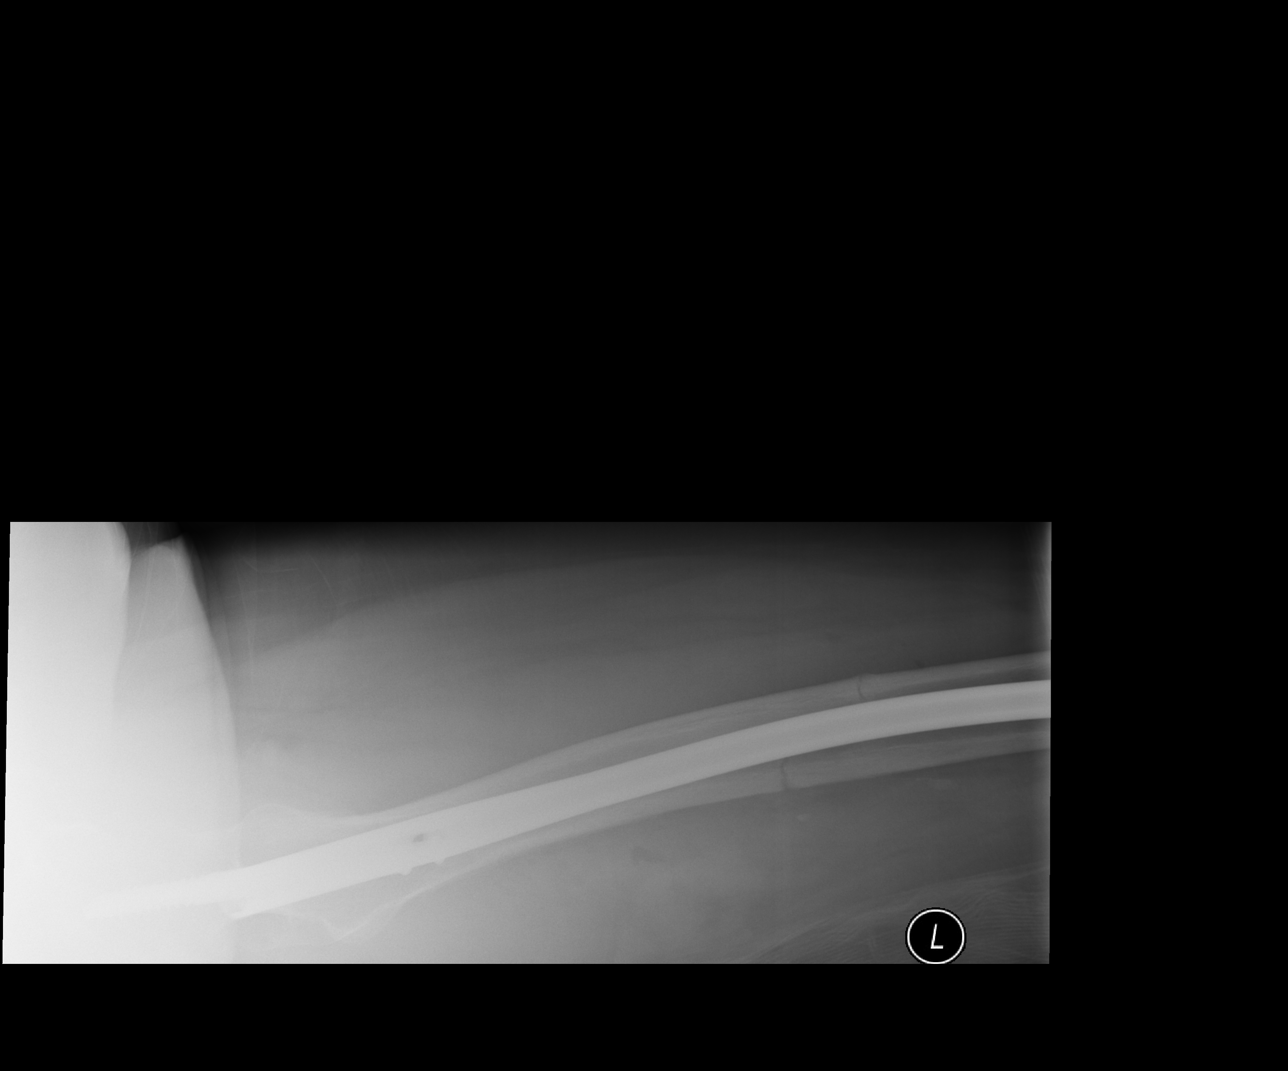

[4 of 4 positions shown; findings below may reference images not displayed]

FINDINGS: IM nail has been placed for fixation of the mid left femoral
fracture in good position and alignment. No complicating features
are seen.
IMPRESSION: IM nail fixation of mid left femoral fracture in good position and
alignment.

## 2019-07-19 DIAGNOSIS — G4734 Idiopathic sleep related nonobstructive alveolar hypoventilation: Secondary | ICD-10-CM | POA: Diagnosis not present

## 2019-07-19 DIAGNOSIS — R0902 Hypoxemia: Secondary | ICD-10-CM | POA: Diagnosis not present

## 2019-07-19 DIAGNOSIS — M810 Age-related osteoporosis without current pathological fracture: Secondary | ICD-10-CM | POA: Diagnosis not present

## 2019-07-20 DIAGNOSIS — M0609 Rheumatoid arthritis without rheumatoid factor, multiple sites: Secondary | ICD-10-CM | POA: Diagnosis not present

## 2019-08-12 DIAGNOSIS — M7061 Trochanteric bursitis, right hip: Secondary | ICD-10-CM | POA: Diagnosis not present

## 2019-08-17 DIAGNOSIS — M0609 Rheumatoid arthritis without rheumatoid factor, multiple sites: Secondary | ICD-10-CM | POA: Diagnosis not present

## 2019-08-18 DIAGNOSIS — M810 Age-related osteoporosis without current pathological fracture: Secondary | ICD-10-CM | POA: Diagnosis not present

## 2019-08-18 DIAGNOSIS — G4734 Idiopathic sleep related nonobstructive alveolar hypoventilation: Secondary | ICD-10-CM | POA: Diagnosis not present

## 2019-08-18 DIAGNOSIS — R0902 Hypoxemia: Secondary | ICD-10-CM | POA: Diagnosis not present

## 2019-09-18 DIAGNOSIS — G4734 Idiopathic sleep related nonobstructive alveolar hypoventilation: Secondary | ICD-10-CM | POA: Diagnosis not present

## 2019-09-18 DIAGNOSIS — M810 Age-related osteoporosis without current pathological fracture: Secondary | ICD-10-CM | POA: Diagnosis not present

## 2019-09-18 DIAGNOSIS — R0902 Hypoxemia: Secondary | ICD-10-CM | POA: Diagnosis not present

## 2019-09-20 DIAGNOSIS — M0609 Rheumatoid arthritis without rheumatoid factor, multiple sites: Secondary | ICD-10-CM | POA: Diagnosis not present

## 2019-10-18 DIAGNOSIS — M0609 Rheumatoid arthritis without rheumatoid factor, multiple sites: Secondary | ICD-10-CM | POA: Diagnosis not present

## 2019-10-19 DIAGNOSIS — M810 Age-related osteoporosis without current pathological fracture: Secondary | ICD-10-CM | POA: Diagnosis not present

## 2019-10-19 DIAGNOSIS — R0902 Hypoxemia: Secondary | ICD-10-CM | POA: Diagnosis not present

## 2019-10-19 DIAGNOSIS — G4734 Idiopathic sleep related nonobstructive alveolar hypoventilation: Secondary | ICD-10-CM | POA: Diagnosis not present

## 2019-11-15 DIAGNOSIS — M810 Age-related osteoporosis without current pathological fracture: Secondary | ICD-10-CM | POA: Diagnosis not present

## 2019-11-15 DIAGNOSIS — M0609 Rheumatoid arthritis without rheumatoid factor, multiple sites: Secondary | ICD-10-CM | POA: Diagnosis not present

## 2019-11-16 DIAGNOSIS — J40 Bronchitis, not specified as acute or chronic: Secondary | ICD-10-CM | POA: Diagnosis not present

## 2019-11-16 DIAGNOSIS — J329 Chronic sinusitis, unspecified: Secondary | ICD-10-CM | POA: Diagnosis not present

## 2019-11-18 DIAGNOSIS — R0902 Hypoxemia: Secondary | ICD-10-CM | POA: Diagnosis not present

## 2019-11-18 DIAGNOSIS — G4734 Idiopathic sleep related nonobstructive alveolar hypoventilation: Secondary | ICD-10-CM | POA: Diagnosis not present

## 2019-11-18 DIAGNOSIS — M810 Age-related osteoporosis without current pathological fracture: Secondary | ICD-10-CM | POA: Diagnosis not present

## 2019-11-28 DIAGNOSIS — M810 Age-related osteoporosis without current pathological fracture: Secondary | ICD-10-CM | POA: Diagnosis not present

## 2019-11-30 DIAGNOSIS — Z23 Encounter for immunization: Secondary | ICD-10-CM | POA: Diagnosis not present

## 2019-12-14 DIAGNOSIS — M0609 Rheumatoid arthritis without rheumatoid factor, multiple sites: Secondary | ICD-10-CM | POA: Diagnosis not present

## 2019-12-15 DIAGNOSIS — I10 Essential (primary) hypertension: Secondary | ICD-10-CM | POA: Diagnosis not present

## 2019-12-15 DIAGNOSIS — R0600 Dyspnea, unspecified: Secondary | ICD-10-CM | POA: Diagnosis not present

## 2019-12-15 DIAGNOSIS — Z6829 Body mass index (BMI) 29.0-29.9, adult: Secondary | ICD-10-CM | POA: Diagnosis not present

## 2019-12-15 DIAGNOSIS — J449 Chronic obstructive pulmonary disease, unspecified: Secondary | ICD-10-CM | POA: Diagnosis not present

## 2019-12-15 DIAGNOSIS — Z9981 Dependence on supplemental oxygen: Secondary | ICD-10-CM | POA: Diagnosis not present

## 2019-12-15 DIAGNOSIS — J9611 Chronic respiratory failure with hypoxia: Secondary | ICD-10-CM | POA: Diagnosis not present

## 2019-12-15 DIAGNOSIS — M7989 Other specified soft tissue disorders: Secondary | ICD-10-CM | POA: Diagnosis not present

## 2019-12-19 DIAGNOSIS — G4734 Idiopathic sleep related nonobstructive alveolar hypoventilation: Secondary | ICD-10-CM | POA: Diagnosis not present

## 2019-12-19 DIAGNOSIS — R0902 Hypoxemia: Secondary | ICD-10-CM | POA: Diagnosis not present

## 2019-12-19 DIAGNOSIS — M810 Age-related osteoporosis without current pathological fracture: Secondary | ICD-10-CM | POA: Diagnosis not present

## 2019-12-26 DIAGNOSIS — N898 Other specified noninflammatory disorders of vagina: Secondary | ICD-10-CM | POA: Diagnosis not present

## 2019-12-26 DIAGNOSIS — M810 Age-related osteoporosis without current pathological fracture: Secondary | ICD-10-CM | POA: Diagnosis not present

## 2019-12-26 DIAGNOSIS — R300 Dysuria: Secondary | ICD-10-CM | POA: Diagnosis not present

## 2019-12-26 DIAGNOSIS — Z1231 Encounter for screening mammogram for malignant neoplasm of breast: Secondary | ICD-10-CM | POA: Diagnosis not present

## 2019-12-26 DIAGNOSIS — N760 Acute vaginitis: Secondary | ICD-10-CM | POA: Diagnosis not present

## 2019-12-26 DIAGNOSIS — B9689 Other specified bacterial agents as the cause of diseases classified elsewhere: Secondary | ICD-10-CM | POA: Diagnosis not present

## 2019-12-26 DIAGNOSIS — Z780 Asymptomatic menopausal state: Secondary | ICD-10-CM | POA: Diagnosis not present

## 2019-12-26 DIAGNOSIS — Z01419 Encounter for gynecological examination (general) (routine) without abnormal findings: Secondary | ICD-10-CM | POA: Diagnosis not present

## 2019-12-29 DIAGNOSIS — L570 Actinic keratosis: Secondary | ICD-10-CM | POA: Diagnosis not present

## 2019-12-29 DIAGNOSIS — L821 Other seborrheic keratosis: Secondary | ICD-10-CM | POA: Diagnosis not present

## 2019-12-29 DIAGNOSIS — L578 Other skin changes due to chronic exposure to nonionizing radiation: Secondary | ICD-10-CM | POA: Diagnosis not present

## 2019-12-29 DIAGNOSIS — D0461 Carcinoma in situ of skin of right upper limb, including shoulder: Secondary | ICD-10-CM | POA: Diagnosis not present

## 2019-12-30 DIAGNOSIS — R0602 Shortness of breath: Secondary | ICD-10-CM | POA: Diagnosis not present

## 2019-12-30 DIAGNOSIS — I10 Essential (primary) hypertension: Secondary | ICD-10-CM | POA: Diagnosis not present

## 2019-12-30 DIAGNOSIS — I2510 Atherosclerotic heart disease of native coronary artery without angina pectoris: Secondary | ICD-10-CM | POA: Diagnosis not present

## 2020-01-03 DIAGNOSIS — Z79899 Other long term (current) drug therapy: Secondary | ICD-10-CM | POA: Diagnosis not present

## 2020-01-03 DIAGNOSIS — M810 Age-related osteoporosis without current pathological fracture: Secondary | ICD-10-CM | POA: Diagnosis not present

## 2020-01-03 DIAGNOSIS — K7460 Unspecified cirrhosis of liver: Secondary | ICD-10-CM | POA: Diagnosis not present

## 2020-01-03 DIAGNOSIS — M0609 Rheumatoid arthritis without rheumatoid factor, multiple sites: Secondary | ICD-10-CM | POA: Diagnosis not present

## 2020-01-04 DIAGNOSIS — E1169 Type 2 diabetes mellitus with other specified complication: Secondary | ICD-10-CM | POA: Diagnosis not present

## 2020-01-04 DIAGNOSIS — E785 Hyperlipidemia, unspecified: Secondary | ICD-10-CM | POA: Diagnosis not present

## 2020-01-04 DIAGNOSIS — Z79899 Other long term (current) drug therapy: Secondary | ICD-10-CM | POA: Diagnosis not present

## 2020-01-10 DIAGNOSIS — E11628 Type 2 diabetes mellitus with other skin complications: Secondary | ICD-10-CM | POA: Diagnosis not present

## 2020-01-10 DIAGNOSIS — E7800 Pure hypercholesterolemia, unspecified: Secondary | ICD-10-CM | POA: Diagnosis not present

## 2020-01-10 DIAGNOSIS — E1169 Type 2 diabetes mellitus with other specified complication: Secondary | ICD-10-CM | POA: Diagnosis not present

## 2020-01-10 DIAGNOSIS — I252 Old myocardial infarction: Secondary | ICD-10-CM | POA: Diagnosis not present

## 2020-01-10 DIAGNOSIS — I25119 Atherosclerotic heart disease of native coronary artery with unspecified angina pectoris: Secondary | ICD-10-CM | POA: Diagnosis not present

## 2020-01-10 DIAGNOSIS — G720 Drug-induced myopathy: Secondary | ICD-10-CM | POA: Diagnosis not present

## 2020-01-10 DIAGNOSIS — E785 Hyperlipidemia, unspecified: Secondary | ICD-10-CM | POA: Diagnosis not present

## 2020-01-10 DIAGNOSIS — L84 Corns and callosities: Secondary | ICD-10-CM | POA: Diagnosis not present

## 2020-01-10 DIAGNOSIS — T8149XA Infection following a procedure, other surgical site, initial encounter: Secondary | ICD-10-CM | POA: Diagnosis not present

## 2020-01-14 DIAGNOSIS — I455 Other specified heart block: Secondary | ICD-10-CM | POA: Diagnosis not present

## 2020-01-14 DIAGNOSIS — E1122 Type 2 diabetes mellitus with diabetic chronic kidney disease: Secondary | ICD-10-CM | POA: Diagnosis not present

## 2020-01-14 DIAGNOSIS — R002 Palpitations: Secondary | ICD-10-CM | POA: Diagnosis not present

## 2020-01-14 DIAGNOSIS — R0902 Hypoxemia: Secondary | ICD-10-CM | POA: Diagnosis not present

## 2020-01-14 DIAGNOSIS — R7401 Elevation of levels of liver transaminase levels: Secondary | ICD-10-CM | POA: Diagnosis not present

## 2020-01-14 DIAGNOSIS — K7460 Unspecified cirrhosis of liver: Secondary | ICD-10-CM | POA: Diagnosis not present

## 2020-01-14 DIAGNOSIS — J449 Chronic obstructive pulmonary disease, unspecified: Secondary | ICD-10-CM | POA: Diagnosis not present

## 2020-01-14 DIAGNOSIS — R079 Chest pain, unspecified: Secondary | ICD-10-CM | POA: Diagnosis not present

## 2020-01-14 DIAGNOSIS — R6884 Jaw pain: Secondary | ICD-10-CM | POA: Diagnosis not present

## 2020-01-14 DIAGNOSIS — I700 Atherosclerosis of aorta: Secondary | ICD-10-CM | POA: Diagnosis not present

## 2020-01-14 DIAGNOSIS — I129 Hypertensive chronic kidney disease with stage 1 through stage 4 chronic kidney disease, or unspecified chronic kidney disease: Secondary | ICD-10-CM | POA: Diagnosis not present

## 2020-01-14 DIAGNOSIS — I959 Hypotension, unspecified: Secondary | ICD-10-CM | POA: Diagnosis not present

## 2020-01-14 DIAGNOSIS — D696 Thrombocytopenia, unspecified: Secondary | ICD-10-CM | POA: Diagnosis not present

## 2020-01-14 DIAGNOSIS — R0600 Dyspnea, unspecified: Secondary | ICD-10-CM | POA: Diagnosis not present

## 2020-01-14 DIAGNOSIS — I25110 Atherosclerotic heart disease of native coronary artery with unstable angina pectoris: Secondary | ICD-10-CM | POA: Diagnosis not present

## 2020-01-14 DIAGNOSIS — M069 Rheumatoid arthritis, unspecified: Secondary | ICD-10-CM | POA: Diagnosis not present

## 2020-01-14 DIAGNOSIS — I4891 Unspecified atrial fibrillation: Secondary | ICD-10-CM | POA: Diagnosis not present

## 2020-01-14 DIAGNOSIS — R0789 Other chest pain: Secondary | ICD-10-CM | POA: Diagnosis not present

## 2020-01-14 DIAGNOSIS — E039 Hypothyroidism, unspecified: Secondary | ICD-10-CM | POA: Diagnosis not present

## 2020-01-14 DIAGNOSIS — E119 Type 2 diabetes mellitus without complications: Secondary | ICD-10-CM | POA: Diagnosis not present

## 2020-01-14 DIAGNOSIS — N183 Chronic kidney disease, stage 3 unspecified: Secondary | ICD-10-CM | POA: Diagnosis not present

## 2020-01-14 DIAGNOSIS — E785 Hyperlipidemia, unspecified: Secondary | ICD-10-CM | POA: Diagnosis not present

## 2020-01-15 DIAGNOSIS — I348 Other nonrheumatic mitral valve disorders: Secondary | ICD-10-CM | POA: Diagnosis not present

## 2020-01-15 DIAGNOSIS — I361 Nonrheumatic tricuspid (valve) insufficiency: Secondary | ICD-10-CM | POA: Diagnosis not present

## 2020-01-15 DIAGNOSIS — I455 Other specified heart block: Secondary | ICD-10-CM | POA: Diagnosis not present

## 2020-01-15 DIAGNOSIS — I358 Other nonrheumatic aortic valve disorders: Secondary | ICD-10-CM | POA: Diagnosis not present

## 2020-01-15 DIAGNOSIS — R0600 Dyspnea, unspecified: Secondary | ICD-10-CM | POA: Diagnosis not present

## 2020-01-15 DIAGNOSIS — I10 Essential (primary) hypertension: Secondary | ICD-10-CM | POA: Diagnosis not present

## 2020-01-15 DIAGNOSIS — J449 Chronic obstructive pulmonary disease, unspecified: Secondary | ICD-10-CM | POA: Diagnosis not present

## 2020-01-15 DIAGNOSIS — I25110 Atherosclerotic heart disease of native coronary artery with unstable angina pectoris: Secondary | ICD-10-CM | POA: Diagnosis not present

## 2020-01-15 DIAGNOSIS — I4891 Unspecified atrial fibrillation: Secondary | ICD-10-CM | POA: Diagnosis not present

## 2020-01-15 DIAGNOSIS — K7460 Unspecified cirrhosis of liver: Secondary | ICD-10-CM | POA: Diagnosis not present

## 2020-01-15 DIAGNOSIS — R7401 Elevation of levels of liver transaminase levels: Secondary | ICD-10-CM | POA: Diagnosis not present

## 2020-01-16 DIAGNOSIS — N183 Chronic kidney disease, stage 3 unspecified: Secondary | ICD-10-CM | POA: Diagnosis not present

## 2020-01-16 DIAGNOSIS — R6884 Jaw pain: Secondary | ICD-10-CM | POA: Diagnosis not present

## 2020-01-16 DIAGNOSIS — I200 Unstable angina: Secondary | ICD-10-CM | POA: Diagnosis not present

## 2020-01-16 DIAGNOSIS — R9431 Abnormal electrocardiogram [ECG] [EKG]: Secondary | ICD-10-CM | POA: Diagnosis not present

## 2020-01-16 DIAGNOSIS — I4891 Unspecified atrial fibrillation: Secondary | ICD-10-CM | POA: Diagnosis not present

## 2020-01-16 DIAGNOSIS — R0600 Dyspnea, unspecified: Secondary | ICD-10-CM | POA: Diagnosis not present

## 2020-01-16 DIAGNOSIS — D696 Thrombocytopenia, unspecified: Secondary | ICD-10-CM | POA: Diagnosis not present

## 2020-01-16 DIAGNOSIS — I25110 Atherosclerotic heart disease of native coronary artery with unstable angina pectoris: Secondary | ICD-10-CM | POA: Diagnosis not present

## 2020-01-17 DIAGNOSIS — I4891 Unspecified atrial fibrillation: Secondary | ICD-10-CM | POA: Diagnosis not present

## 2020-01-17 DIAGNOSIS — D696 Thrombocytopenia, unspecified: Secondary | ICD-10-CM | POA: Diagnosis not present

## 2020-01-17 DIAGNOSIS — I10 Essential (primary) hypertension: Secondary | ICD-10-CM | POA: Diagnosis not present

## 2020-01-17 DIAGNOSIS — R0600 Dyspnea, unspecified: Secondary | ICD-10-CM | POA: Diagnosis not present

## 2020-01-17 DIAGNOSIS — I480 Paroxysmal atrial fibrillation: Secondary | ICD-10-CM | POA: Diagnosis not present

## 2020-01-17 DIAGNOSIS — I200 Unstable angina: Secondary | ICD-10-CM | POA: Diagnosis not present

## 2020-01-17 DIAGNOSIS — N183 Chronic kidney disease, stage 3 unspecified: Secondary | ICD-10-CM | POA: Diagnosis not present

## 2020-01-17 DIAGNOSIS — I25110 Atherosclerotic heart disease of native coronary artery with unstable angina pectoris: Secondary | ICD-10-CM | POA: Diagnosis not present

## 2020-01-18 DIAGNOSIS — R0902 Hypoxemia: Secondary | ICD-10-CM | POA: Diagnosis not present

## 2020-01-18 DIAGNOSIS — M810 Age-related osteoporosis without current pathological fracture: Secondary | ICD-10-CM | POA: Diagnosis not present

## 2020-01-18 DIAGNOSIS — G4734 Idiopathic sleep related nonobstructive alveolar hypoventilation: Secondary | ICD-10-CM | POA: Diagnosis not present

## 2020-01-26 DIAGNOSIS — K7460 Unspecified cirrhosis of liver: Secondary | ICD-10-CM | POA: Diagnosis not present

## 2020-01-26 DIAGNOSIS — K7689 Other specified diseases of liver: Secondary | ICD-10-CM | POA: Diagnosis not present

## 2020-01-26 DIAGNOSIS — K8070 Calculus of gallbladder and bile duct without cholecystitis without obstruction: Secondary | ICD-10-CM | POA: Diagnosis not present

## 2020-01-30 DIAGNOSIS — I480 Paroxysmal atrial fibrillation: Secondary | ICD-10-CM | POA: Diagnosis not present

## 2020-01-30 DIAGNOSIS — I25119 Atherosclerotic heart disease of native coronary artery with unspecified angina pectoris: Secondary | ICD-10-CM | POA: Diagnosis not present

## 2020-01-30 DIAGNOSIS — T82897D Other specified complication of cardiac prosthetic devices, implants and grafts, subsequent encounter: Secondary | ICD-10-CM | POA: Diagnosis not present

## 2020-01-30 DIAGNOSIS — D6869 Other thrombophilia: Secondary | ICD-10-CM | POA: Diagnosis not present

## 2020-01-31 DIAGNOSIS — I480 Paroxysmal atrial fibrillation: Secondary | ICD-10-CM | POA: Diagnosis not present

## 2020-01-31 DIAGNOSIS — I10 Essential (primary) hypertension: Secondary | ICD-10-CM | POA: Diagnosis not present

## 2020-01-31 DIAGNOSIS — I2510 Atherosclerotic heart disease of native coronary artery without angina pectoris: Secondary | ICD-10-CM | POA: Diagnosis not present

## 2020-01-31 DIAGNOSIS — I25110 Atherosclerotic heart disease of native coronary artery with unstable angina pectoris: Secondary | ICD-10-CM | POA: Diagnosis not present

## 2020-02-06 DIAGNOSIS — J40 Bronchitis, not specified as acute or chronic: Secondary | ICD-10-CM | POA: Diagnosis not present

## 2020-02-06 DIAGNOSIS — J329 Chronic sinusitis, unspecified: Secondary | ICD-10-CM | POA: Diagnosis not present

## 2020-02-06 DIAGNOSIS — Z9981 Dependence on supplemental oxygen: Secondary | ICD-10-CM | POA: Diagnosis not present

## 2020-02-06 DIAGNOSIS — J9611 Chronic respiratory failure with hypoxia: Secondary | ICD-10-CM | POA: Diagnosis not present

## 2020-02-06 DIAGNOSIS — E7800 Pure hypercholesterolemia, unspecified: Secondary | ICD-10-CM | POA: Diagnosis not present

## 2020-02-06 DIAGNOSIS — I10 Essential (primary) hypertension: Secondary | ICD-10-CM | POA: Diagnosis not present

## 2020-02-06 DIAGNOSIS — G4734 Idiopathic sleep related nonobstructive alveolar hypoventilation: Secondary | ICD-10-CM | POA: Diagnosis not present

## 2020-02-06 DIAGNOSIS — E1169 Type 2 diabetes mellitus with other specified complication: Secondary | ICD-10-CM | POA: Diagnosis not present

## 2020-02-10 DIAGNOSIS — R9431 Abnormal electrocardiogram [ECG] [EKG]: Secondary | ICD-10-CM | POA: Diagnosis not present

## 2020-02-10 DIAGNOSIS — R059 Cough, unspecified: Secondary | ICD-10-CM | POA: Diagnosis not present

## 2020-02-10 DIAGNOSIS — R0602 Shortness of breath: Secondary | ICD-10-CM | POA: Diagnosis not present

## 2020-02-10 DIAGNOSIS — R609 Edema, unspecified: Secondary | ICD-10-CM | POA: Diagnosis not present

## 2020-02-10 DIAGNOSIS — F32A Depression, unspecified: Secondary | ICD-10-CM | POA: Diagnosis not present

## 2020-02-11 DIAGNOSIS — R609 Edema, unspecified: Secondary | ICD-10-CM | POA: Diagnosis not present

## 2020-02-11 DIAGNOSIS — Z87891 Personal history of nicotine dependence: Secondary | ICD-10-CM | POA: Diagnosis not present

## 2020-02-11 DIAGNOSIS — R069 Unspecified abnormalities of breathing: Secondary | ICD-10-CM | POA: Diagnosis not present

## 2020-02-11 DIAGNOSIS — R059 Cough, unspecified: Secondary | ICD-10-CM | POA: Diagnosis not present

## 2020-02-11 DIAGNOSIS — I252 Old myocardial infarction: Secondary | ICD-10-CM | POA: Diagnosis not present

## 2020-02-11 DIAGNOSIS — I10 Essential (primary) hypertension: Secondary | ICD-10-CM | POA: Diagnosis not present

## 2020-02-11 DIAGNOSIS — E7800 Pure hypercholesterolemia, unspecified: Secondary | ICD-10-CM | POA: Diagnosis not present

## 2020-02-11 DIAGNOSIS — I700 Atherosclerosis of aorta: Secondary | ICD-10-CM | POA: Diagnosis not present

## 2020-02-11 DIAGNOSIS — J449 Chronic obstructive pulmonary disease, unspecified: Secondary | ICD-10-CM | POA: Diagnosis not present

## 2020-02-11 DIAGNOSIS — R000 Tachycardia, unspecified: Secondary | ICD-10-CM | POA: Diagnosis not present

## 2020-02-11 DIAGNOSIS — E119 Type 2 diabetes mellitus without complications: Secondary | ICD-10-CM | POA: Diagnosis not present

## 2020-02-11 DIAGNOSIS — R0602 Shortness of breath: Secondary | ICD-10-CM | POA: Diagnosis not present

## 2020-02-11 DIAGNOSIS — J069 Acute upper respiratory infection, unspecified: Secondary | ICD-10-CM | POA: Diagnosis not present

## 2020-02-11 DIAGNOSIS — I959 Hypotension, unspecified: Secondary | ICD-10-CM | POA: Diagnosis not present

## 2020-02-11 DIAGNOSIS — R457 State of emotional shock and stress, unspecified: Secondary | ICD-10-CM | POA: Diagnosis not present

## 2020-02-11 DIAGNOSIS — I2510 Atherosclerotic heart disease of native coronary artery without angina pectoris: Secondary | ICD-10-CM | POA: Diagnosis not present

## 2020-02-18 DIAGNOSIS — G4734 Idiopathic sleep related nonobstructive alveolar hypoventilation: Secondary | ICD-10-CM | POA: Diagnosis not present

## 2020-02-18 DIAGNOSIS — R0902 Hypoxemia: Secondary | ICD-10-CM | POA: Diagnosis not present

## 2020-02-18 DIAGNOSIS — M810 Age-related osteoporosis without current pathological fracture: Secondary | ICD-10-CM | POA: Diagnosis not present

## 2020-02-25 DIAGNOSIS — N183 Chronic kidney disease, stage 3 unspecified: Secondary | ICD-10-CM | POA: Diagnosis not present

## 2020-02-25 DIAGNOSIS — K7460 Unspecified cirrhosis of liver: Secondary | ICD-10-CM | POA: Diagnosis not present

## 2020-02-25 DIAGNOSIS — I959 Hypotension, unspecified: Secondary | ICD-10-CM | POA: Diagnosis not present

## 2020-02-25 DIAGNOSIS — R0902 Hypoxemia: Secondary | ICD-10-CM | POA: Diagnosis not present

## 2020-02-25 DIAGNOSIS — I499 Cardiac arrhythmia, unspecified: Secondary | ICD-10-CM | POA: Diagnosis not present

## 2020-02-25 DIAGNOSIS — R079 Chest pain, unspecified: Secondary | ICD-10-CM | POA: Diagnosis not present

## 2020-02-25 DIAGNOSIS — R0789 Other chest pain: Secondary | ICD-10-CM | POA: Diagnosis not present

## 2020-02-25 DIAGNOSIS — K922 Gastrointestinal hemorrhage, unspecified: Secondary | ICD-10-CM | POA: Diagnosis not present

## 2020-02-25 DIAGNOSIS — J449 Chronic obstructive pulmonary disease, unspecified: Secondary | ICD-10-CM | POA: Diagnosis not present

## 2020-02-25 DIAGNOSIS — I10 Essential (primary) hypertension: Secondary | ICD-10-CM | POA: Diagnosis not present

## 2020-02-25 DIAGNOSIS — R6884 Jaw pain: Secondary | ICD-10-CM | POA: Diagnosis not present

## 2020-02-25 DIAGNOSIS — D62 Acute posthemorrhagic anemia: Secondary | ICD-10-CM | POA: Diagnosis not present

## 2020-02-25 DIAGNOSIS — E1142 Type 2 diabetes mellitus with diabetic polyneuropathy: Secondary | ICD-10-CM | POA: Diagnosis not present

## 2020-02-25 DIAGNOSIS — I2510 Atherosclerotic heart disease of native coronary artery without angina pectoris: Secondary | ICD-10-CM | POA: Diagnosis not present

## 2020-02-25 DIAGNOSIS — Z20822 Contact with and (suspected) exposure to covid-19: Secondary | ICD-10-CM | POA: Diagnosis not present

## 2020-02-25 DIAGNOSIS — I4891 Unspecified atrial fibrillation: Secondary | ICD-10-CM | POA: Diagnosis not present

## 2020-02-25 DIAGNOSIS — R58 Hemorrhage, not elsewhere classified: Secondary | ICD-10-CM | POA: Diagnosis not present

## 2020-02-25 DIAGNOSIS — I25118 Atherosclerotic heart disease of native coronary artery with other forms of angina pectoris: Secondary | ICD-10-CM | POA: Diagnosis not present

## 2020-02-26 DIAGNOSIS — I10 Essential (primary) hypertension: Secondary | ICD-10-CM | POA: Diagnosis not present

## 2020-02-26 DIAGNOSIS — D62 Acute posthemorrhagic anemia: Secondary | ICD-10-CM | POA: Diagnosis not present

## 2020-02-26 DIAGNOSIS — E870 Hyperosmolality and hypernatremia: Secondary | ICD-10-CM | POA: Diagnosis not present

## 2020-02-26 DIAGNOSIS — I482 Chronic atrial fibrillation, unspecified: Secondary | ICD-10-CM | POA: Diagnosis not present

## 2020-02-26 DIAGNOSIS — J449 Chronic obstructive pulmonary disease, unspecified: Secondary | ICD-10-CM | POA: Diagnosis not present

## 2020-02-26 DIAGNOSIS — I4891 Unspecified atrial fibrillation: Secondary | ICD-10-CM | POA: Diagnosis not present

## 2020-02-26 DIAGNOSIS — I25118 Atherosclerotic heart disease of native coronary artery with other forms of angina pectoris: Secondary | ICD-10-CM | POA: Diagnosis not present

## 2020-02-26 DIAGNOSIS — N183 Chronic kidney disease, stage 3 unspecified: Secondary | ICD-10-CM | POA: Diagnosis not present

## 2020-02-26 DIAGNOSIS — K922 Gastrointestinal hemorrhage, unspecified: Secondary | ICD-10-CM | POA: Diagnosis not present

## 2020-02-27 DIAGNOSIS — D62 Acute posthemorrhagic anemia: Secondary | ICD-10-CM | POA: Diagnosis not present

## 2020-02-27 DIAGNOSIS — J449 Chronic obstructive pulmonary disease, unspecified: Secondary | ICD-10-CM | POA: Diagnosis not present

## 2020-02-27 DIAGNOSIS — I482 Chronic atrial fibrillation, unspecified: Secondary | ICD-10-CM | POA: Diagnosis not present

## 2020-02-27 DIAGNOSIS — K922 Gastrointestinal hemorrhage, unspecified: Secondary | ICD-10-CM | POA: Diagnosis not present

## 2020-02-27 DIAGNOSIS — I480 Paroxysmal atrial fibrillation: Secondary | ICD-10-CM | POA: Diagnosis not present

## 2020-02-27 DIAGNOSIS — E870 Hyperosmolality and hypernatremia: Secondary | ICD-10-CM | POA: Diagnosis not present

## 2020-02-27 DIAGNOSIS — I10 Essential (primary) hypertension: Secondary | ICD-10-CM | POA: Diagnosis not present

## 2020-02-27 DIAGNOSIS — I25118 Atherosclerotic heart disease of native coronary artery with other forms of angina pectoris: Secondary | ICD-10-CM | POA: Diagnosis not present

## 2020-02-27 DIAGNOSIS — N183 Chronic kidney disease, stage 3 unspecified: Secondary | ICD-10-CM | POA: Diagnosis not present

## 2020-02-27 DIAGNOSIS — I4891 Unspecified atrial fibrillation: Secondary | ICD-10-CM | POA: Diagnosis not present

## 2020-02-27 DIAGNOSIS — I25110 Atherosclerotic heart disease of native coronary artery with unstable angina pectoris: Secondary | ICD-10-CM | POA: Diagnosis not present

## 2020-02-27 DIAGNOSIS — K7460 Unspecified cirrhosis of liver: Secondary | ICD-10-CM | POA: Diagnosis not present

## 2020-02-28 DIAGNOSIS — I25118 Atherosclerotic heart disease of native coronary artery with other forms of angina pectoris: Secondary | ICD-10-CM | POA: Diagnosis not present

## 2020-02-28 DIAGNOSIS — K922 Gastrointestinal hemorrhage, unspecified: Secondary | ICD-10-CM | POA: Diagnosis not present

## 2020-02-28 DIAGNOSIS — I10 Essential (primary) hypertension: Secondary | ICD-10-CM | POA: Diagnosis not present

## 2020-02-28 DIAGNOSIS — E870 Hyperosmolality and hypernatremia: Secondary | ICD-10-CM | POA: Diagnosis not present

## 2020-02-28 DIAGNOSIS — D62 Acute posthemorrhagic anemia: Secondary | ICD-10-CM | POA: Diagnosis not present

## 2020-02-28 DIAGNOSIS — K5730 Diverticulosis of large intestine without perforation or abscess without bleeding: Secondary | ICD-10-CM | POA: Diagnosis not present

## 2020-02-28 DIAGNOSIS — D500 Iron deficiency anemia secondary to blood loss (chronic): Secondary | ICD-10-CM | POA: Diagnosis not present

## 2020-02-28 DIAGNOSIS — K319 Disease of stomach and duodenum, unspecified: Secondary | ICD-10-CM | POA: Diagnosis not present

## 2020-02-28 DIAGNOSIS — I482 Chronic atrial fibrillation, unspecified: Secondary | ICD-10-CM | POA: Diagnosis not present

## 2020-02-28 DIAGNOSIS — N183 Chronic kidney disease, stage 3 unspecified: Secondary | ICD-10-CM | POA: Diagnosis not present

## 2020-02-28 DIAGNOSIS — J449 Chronic obstructive pulmonary disease, unspecified: Secondary | ICD-10-CM | POA: Diagnosis not present

## 2020-02-28 DIAGNOSIS — I480 Paroxysmal atrial fibrillation: Secondary | ICD-10-CM | POA: Diagnosis not present

## 2020-02-28 DIAGNOSIS — I25110 Atherosclerotic heart disease of native coronary artery with unstable angina pectoris: Secondary | ICD-10-CM | POA: Diagnosis not present

## 2020-02-28 DIAGNOSIS — K5520 Angiodysplasia of colon without hemorrhage: Secondary | ICD-10-CM | POA: Diagnosis not present

## 2020-02-28 DIAGNOSIS — I4891 Unspecified atrial fibrillation: Secondary | ICD-10-CM | POA: Diagnosis not present

## 2020-02-29 DIAGNOSIS — K922 Gastrointestinal hemorrhage, unspecified: Secondary | ICD-10-CM | POA: Diagnosis not present

## 2020-02-29 DIAGNOSIS — I25118 Atherosclerotic heart disease of native coronary artery with other forms of angina pectoris: Secondary | ICD-10-CM | POA: Diagnosis not present

## 2020-02-29 DIAGNOSIS — K5730 Diverticulosis of large intestine without perforation or abscess without bleeding: Secondary | ICD-10-CM | POA: Diagnosis not present

## 2020-02-29 DIAGNOSIS — D649 Anemia, unspecified: Secondary | ICD-10-CM | POA: Diagnosis not present

## 2020-02-29 DIAGNOSIS — I480 Paroxysmal atrial fibrillation: Secondary | ICD-10-CM | POA: Diagnosis not present

## 2020-02-29 DIAGNOSIS — I25110 Atherosclerotic heart disease of native coronary artery with unstable angina pectoris: Secondary | ICD-10-CM | POA: Diagnosis not present

## 2020-03-07 DIAGNOSIS — Z79899 Other long term (current) drug therapy: Secondary | ICD-10-CM | POA: Diagnosis not present

## 2020-03-07 DIAGNOSIS — K5521 Angiodysplasia of colon with hemorrhage: Secondary | ICD-10-CM | POA: Diagnosis not present

## 2020-03-07 DIAGNOSIS — D500 Iron deficiency anemia secondary to blood loss (chronic): Secondary | ICD-10-CM | POA: Diagnosis not present

## 2020-03-07 DIAGNOSIS — D62 Acute posthemorrhagic anemia: Secondary | ICD-10-CM | POA: Diagnosis not present

## 2020-03-07 DIAGNOSIS — I480 Paroxysmal atrial fibrillation: Secondary | ICD-10-CM | POA: Diagnosis not present

## 2020-03-07 DIAGNOSIS — E7800 Pure hypercholesterolemia, unspecified: Secondary | ICD-10-CM | POA: Diagnosis not present

## 2020-03-07 DIAGNOSIS — E611 Iron deficiency: Secondary | ICD-10-CM | POA: Diagnosis not present

## 2020-03-07 DIAGNOSIS — S81812A Laceration without foreign body, left lower leg, initial encounter: Secondary | ICD-10-CM | POA: Diagnosis not present

## 2020-03-07 DIAGNOSIS — D6489 Other specified anemias: Secondary | ICD-10-CM | POA: Diagnosis not present

## 2020-03-20 ENCOUNTER — Institutional Professional Consult (permissible substitution): Payer: Medicare Other | Admitting: Pulmonary Disease

## 2020-03-20 DIAGNOSIS — I25110 Atherosclerotic heart disease of native coronary artery with unstable angina pectoris: Secondary | ICD-10-CM | POA: Diagnosis not present

## 2020-03-20 DIAGNOSIS — I25118 Atherosclerotic heart disease of native coronary artery with other forms of angina pectoris: Secondary | ICD-10-CM | POA: Diagnosis not present

## 2020-03-20 DIAGNOSIS — I480 Paroxysmal atrial fibrillation: Secondary | ICD-10-CM | POA: Diagnosis not present

## 2020-03-20 DIAGNOSIS — M810 Age-related osteoporosis without current pathological fracture: Secondary | ICD-10-CM | POA: Diagnosis not present

## 2020-03-20 DIAGNOSIS — G4734 Idiopathic sleep related nonobstructive alveolar hypoventilation: Secondary | ICD-10-CM | POA: Diagnosis not present

## 2020-03-20 DIAGNOSIS — I10 Essential (primary) hypertension: Secondary | ICD-10-CM | POA: Diagnosis not present

## 2020-03-20 DIAGNOSIS — R0902 Hypoxemia: Secondary | ICD-10-CM | POA: Diagnosis not present

## 2020-03-23 ENCOUNTER — Institutional Professional Consult (permissible substitution): Payer: Medicare Other | Admitting: Pulmonary Disease

## 2020-03-27 DIAGNOSIS — K5520 Angiodysplasia of colon without hemorrhage: Secondary | ICD-10-CM | POA: Diagnosis not present

## 2020-03-27 DIAGNOSIS — K7460 Unspecified cirrhosis of liver: Secondary | ICD-10-CM | POA: Diagnosis not present

## 2020-03-27 DIAGNOSIS — I8500 Esophageal varices without bleeding: Secondary | ICD-10-CM | POA: Diagnosis not present

## 2020-03-27 DIAGNOSIS — K760 Fatty (change of) liver, not elsewhere classified: Secondary | ICD-10-CM | POA: Diagnosis not present

## 2020-03-27 DIAGNOSIS — R7989 Other specified abnormal findings of blood chemistry: Secondary | ICD-10-CM | POA: Diagnosis not present

## 2020-03-27 DIAGNOSIS — D500 Iron deficiency anemia secondary to blood loss (chronic): Secondary | ICD-10-CM | POA: Diagnosis not present

## 2020-03-27 DIAGNOSIS — J449 Chronic obstructive pulmonary disease, unspecified: Secondary | ICD-10-CM | POA: Diagnosis not present

## 2020-04-09 DIAGNOSIS — E7800 Pure hypercholesterolemia, unspecified: Secondary | ICD-10-CM | POA: Diagnosis not present

## 2020-04-09 DIAGNOSIS — Z79899 Other long term (current) drug therapy: Secondary | ICD-10-CM | POA: Diagnosis not present

## 2020-04-09 DIAGNOSIS — E1169 Type 2 diabetes mellitus with other specified complication: Secondary | ICD-10-CM | POA: Diagnosis not present

## 2020-04-11 DIAGNOSIS — E7800 Pure hypercholesterolemia, unspecified: Secondary | ICD-10-CM | POA: Diagnosis not present

## 2020-04-11 DIAGNOSIS — K5521 Angiodysplasia of colon with hemorrhage: Secondary | ICD-10-CM | POA: Diagnosis not present

## 2020-04-11 DIAGNOSIS — E785 Hyperlipidemia, unspecified: Secondary | ICD-10-CM | POA: Diagnosis not present

## 2020-04-11 DIAGNOSIS — E1169 Type 2 diabetes mellitus with other specified complication: Secondary | ICD-10-CM | POA: Diagnosis not present

## 2020-04-11 DIAGNOSIS — D500 Iron deficiency anemia secondary to blood loss (chronic): Secondary | ICD-10-CM | POA: Diagnosis not present

## 2020-04-11 DIAGNOSIS — N183 Chronic kidney disease, stage 3 unspecified: Secondary | ICD-10-CM | POA: Diagnosis not present

## 2020-04-11 DIAGNOSIS — D62 Acute posthemorrhagic anemia: Secondary | ICD-10-CM | POA: Diagnosis not present

## 2020-04-11 DIAGNOSIS — E611 Iron deficiency: Secondary | ICD-10-CM | POA: Diagnosis not present

## 2020-04-11 DIAGNOSIS — E1122 Type 2 diabetes mellitus with diabetic chronic kidney disease: Secondary | ICD-10-CM | POA: Diagnosis not present

## 2020-04-17 DIAGNOSIS — G4734 Idiopathic sleep related nonobstructive alveolar hypoventilation: Secondary | ICD-10-CM | POA: Diagnosis not present

## 2020-04-17 DIAGNOSIS — R0902 Hypoxemia: Secondary | ICD-10-CM | POA: Diagnosis not present

## 2020-04-17 DIAGNOSIS — M810 Age-related osteoporosis without current pathological fracture: Secondary | ICD-10-CM | POA: Diagnosis not present

## 2020-04-20 DIAGNOSIS — B029 Zoster without complications: Secondary | ICD-10-CM | POA: Diagnosis not present

## 2020-04-20 DIAGNOSIS — Z6828 Body mass index (BMI) 28.0-28.9, adult: Secondary | ICD-10-CM | POA: Diagnosis not present

## 2020-04-20 DIAGNOSIS — M62830 Muscle spasm of back: Secondary | ICD-10-CM | POA: Diagnosis not present

## 2020-04-20 DIAGNOSIS — E663 Overweight: Secondary | ICD-10-CM | POA: Diagnosis not present

## 2020-04-20 DIAGNOSIS — M7918 Myalgia, other site: Secondary | ICD-10-CM | POA: Diagnosis not present

## 2020-04-25 DIAGNOSIS — M79672 Pain in left foot: Secondary | ICD-10-CM | POA: Diagnosis not present

## 2020-04-25 DIAGNOSIS — E1142 Type 2 diabetes mellitus with diabetic polyneuropathy: Secondary | ICD-10-CM | POA: Diagnosis not present

## 2020-04-25 DIAGNOSIS — L84 Corns and callosities: Secondary | ICD-10-CM | POA: Diagnosis not present

## 2020-04-25 DIAGNOSIS — B351 Tinea unguium: Secondary | ICD-10-CM | POA: Diagnosis not present

## 2020-05-18 DIAGNOSIS — G4734 Idiopathic sleep related nonobstructive alveolar hypoventilation: Secondary | ICD-10-CM | POA: Diagnosis not present

## 2020-05-18 DIAGNOSIS — R0902 Hypoxemia: Secondary | ICD-10-CM | POA: Diagnosis not present

## 2020-05-18 DIAGNOSIS — M810 Age-related osteoporosis without current pathological fracture: Secondary | ICD-10-CM | POA: Diagnosis not present

## 2020-05-31 DIAGNOSIS — M810 Age-related osteoporosis without current pathological fracture: Secondary | ICD-10-CM | POA: Diagnosis not present

## 2020-06-17 DIAGNOSIS — R0902 Hypoxemia: Secondary | ICD-10-CM | POA: Diagnosis not present

## 2020-06-17 DIAGNOSIS — G4734 Idiopathic sleep related nonobstructive alveolar hypoventilation: Secondary | ICD-10-CM | POA: Diagnosis not present

## 2020-06-17 DIAGNOSIS — M810 Age-related osteoporosis without current pathological fracture: Secondary | ICD-10-CM | POA: Diagnosis not present

## 2020-06-18 DIAGNOSIS — K7460 Unspecified cirrhosis of liver: Secondary | ICD-10-CM | POA: Diagnosis not present

## 2020-06-18 DIAGNOSIS — I8500 Esophageal varices without bleeding: Secondary | ICD-10-CM | POA: Diagnosis not present

## 2020-06-18 DIAGNOSIS — D500 Iron deficiency anemia secondary to blood loss (chronic): Secondary | ICD-10-CM | POA: Diagnosis not present

## 2020-06-18 DIAGNOSIS — K5520 Angiodysplasia of colon without hemorrhage: Secondary | ICD-10-CM | POA: Diagnosis not present

## 2020-06-27 DIAGNOSIS — J9611 Chronic respiratory failure with hypoxia: Secondary | ICD-10-CM | POA: Diagnosis not present

## 2020-06-27 DIAGNOSIS — M0579 Rheumatoid arthritis with rheumatoid factor of multiple sites without organ or systems involvement: Secondary | ICD-10-CM | POA: Diagnosis not present

## 2020-06-27 DIAGNOSIS — K219 Gastro-esophageal reflux disease without esophagitis: Secondary | ICD-10-CM | POA: Diagnosis not present

## 2020-06-27 DIAGNOSIS — Z9981 Dependence on supplemental oxygen: Secondary | ICD-10-CM | POA: Diagnosis not present

## 2020-06-27 DIAGNOSIS — I10 Essential (primary) hypertension: Secondary | ICD-10-CM | POA: Diagnosis not present

## 2020-06-27 DIAGNOSIS — Z6828 Body mass index (BMI) 28.0-28.9, adult: Secondary | ICD-10-CM | POA: Diagnosis not present

## 2020-06-27 DIAGNOSIS — R059 Cough, unspecified: Secondary | ICD-10-CM | POA: Diagnosis not present

## 2020-06-27 DIAGNOSIS — M069 Rheumatoid arthritis, unspecified: Secondary | ICD-10-CM | POA: Diagnosis not present

## 2020-06-28 DIAGNOSIS — N898 Other specified noninflammatory disorders of vagina: Secondary | ICD-10-CM | POA: Diagnosis not present

## 2020-06-28 DIAGNOSIS — R10819 Abdominal tenderness, unspecified site: Secondary | ICD-10-CM | POA: Diagnosis not present

## 2020-06-28 DIAGNOSIS — R300 Dysuria: Secondary | ICD-10-CM | POA: Diagnosis not present

## 2020-06-28 DIAGNOSIS — N390 Urinary tract infection, site not specified: Secondary | ICD-10-CM | POA: Diagnosis not present

## 2020-07-03 DIAGNOSIS — R0602 Shortness of breath: Secondary | ICD-10-CM | POA: Diagnosis not present

## 2020-07-03 DIAGNOSIS — R9431 Abnormal electrocardiogram [ECG] [EKG]: Secondary | ICD-10-CM | POA: Diagnosis not present

## 2020-07-03 DIAGNOSIS — I10 Essential (primary) hypertension: Secondary | ICD-10-CM | POA: Diagnosis not present

## 2020-07-03 DIAGNOSIS — J441 Chronic obstructive pulmonary disease with (acute) exacerbation: Secondary | ICD-10-CM | POA: Diagnosis not present

## 2020-07-03 DIAGNOSIS — R531 Weakness: Secondary | ICD-10-CM | POA: Diagnosis not present

## 2020-07-03 DIAGNOSIS — E1142 Type 2 diabetes mellitus with diabetic polyneuropathy: Secondary | ICD-10-CM | POA: Diagnosis not present

## 2020-07-03 DIAGNOSIS — E785 Hyperlipidemia, unspecified: Secondary | ICD-10-CM | POA: Diagnosis not present

## 2020-07-03 DIAGNOSIS — J449 Chronic obstructive pulmonary disease, unspecified: Secondary | ICD-10-CM | POA: Diagnosis not present

## 2020-07-03 DIAGNOSIS — N183 Chronic kidney disease, stage 3 unspecified: Secondary | ICD-10-CM | POA: Diagnosis not present

## 2020-07-03 DIAGNOSIS — I4891 Unspecified atrial fibrillation: Secondary | ICD-10-CM | POA: Diagnosis not present

## 2020-07-03 DIAGNOSIS — I2510 Atherosclerotic heart disease of native coronary artery without angina pectoris: Secondary | ICD-10-CM | POA: Diagnosis not present

## 2020-07-03 DIAGNOSIS — R069 Unspecified abnormalities of breathing: Secondary | ICD-10-CM | POA: Diagnosis not present

## 2020-07-03 DIAGNOSIS — I129 Hypertensive chronic kidney disease with stage 1 through stage 4 chronic kidney disease, or unspecified chronic kidney disease: Secondary | ICD-10-CM | POA: Diagnosis not present

## 2020-07-03 DIAGNOSIS — E1122 Type 2 diabetes mellitus with diabetic chronic kidney disease: Secondary | ICD-10-CM | POA: Diagnosis not present

## 2020-07-03 DIAGNOSIS — J208 Acute bronchitis due to other specified organisms: Secondary | ICD-10-CM | POA: Diagnosis not present

## 2020-07-03 DIAGNOSIS — R059 Cough, unspecified: Secondary | ICD-10-CM | POA: Diagnosis not present

## 2020-07-03 DIAGNOSIS — K219 Gastro-esophageal reflux disease without esophagitis: Secondary | ICD-10-CM | POA: Diagnosis not present

## 2020-07-03 DIAGNOSIS — E039 Hypothyroidism, unspecified: Secondary | ICD-10-CM | POA: Diagnosis not present

## 2020-07-03 DIAGNOSIS — I700 Atherosclerosis of aorta: Secondary | ICD-10-CM | POA: Diagnosis not present

## 2020-07-04 DIAGNOSIS — R9431 Abnormal electrocardiogram [ECG] [EKG]: Secondary | ICD-10-CM | POA: Diagnosis not present

## 2020-07-04 DIAGNOSIS — I480 Paroxysmal atrial fibrillation: Secondary | ICD-10-CM | POA: Diagnosis not present

## 2020-07-04 DIAGNOSIS — I2510 Atherosclerotic heart disease of native coronary artery without angina pectoris: Secondary | ICD-10-CM | POA: Diagnosis not present

## 2020-07-04 DIAGNOSIS — I4891 Unspecified atrial fibrillation: Secondary | ICD-10-CM | POA: Diagnosis not present

## 2020-07-04 DIAGNOSIS — J441 Chronic obstructive pulmonary disease with (acute) exacerbation: Secondary | ICD-10-CM | POA: Diagnosis not present

## 2020-07-04 DIAGNOSIS — I248 Other forms of acute ischemic heart disease: Secondary | ICD-10-CM | POA: Diagnosis not present

## 2020-07-08 DIAGNOSIS — R0902 Hypoxemia: Secondary | ICD-10-CM | POA: Diagnosis not present

## 2020-07-08 DIAGNOSIS — E119 Type 2 diabetes mellitus without complications: Secondary | ICD-10-CM | POA: Diagnosis not present

## 2020-07-08 DIAGNOSIS — J449 Chronic obstructive pulmonary disease, unspecified: Secondary | ICD-10-CM | POA: Diagnosis not present

## 2020-07-08 DIAGNOSIS — F419 Anxiety disorder, unspecified: Secondary | ICD-10-CM | POA: Diagnosis not present

## 2020-07-08 DIAGNOSIS — I509 Heart failure, unspecified: Secondary | ICD-10-CM | POA: Diagnosis not present

## 2020-07-08 DIAGNOSIS — I252 Old myocardial infarction: Secondary | ICD-10-CM | POA: Diagnosis not present

## 2020-07-10 DIAGNOSIS — E1169 Type 2 diabetes mellitus with other specified complication: Secondary | ICD-10-CM | POA: Diagnosis not present

## 2020-07-10 DIAGNOSIS — D62 Acute posthemorrhagic anemia: Secondary | ICD-10-CM | POA: Diagnosis not present

## 2020-07-10 DIAGNOSIS — Z9981 Dependence on supplemental oxygen: Secondary | ICD-10-CM | POA: Diagnosis not present

## 2020-07-10 DIAGNOSIS — F411 Generalized anxiety disorder: Secondary | ICD-10-CM | POA: Diagnosis not present

## 2020-07-10 DIAGNOSIS — R7981 Abnormal blood-gas level: Secondary | ICD-10-CM | POA: Diagnosis not present

## 2020-07-10 DIAGNOSIS — J9611 Chronic respiratory failure with hypoxia: Secondary | ICD-10-CM | POA: Diagnosis not present

## 2020-07-10 DIAGNOSIS — R5381 Other malaise: Secondary | ICD-10-CM | POA: Diagnosis not present

## 2020-07-10 DIAGNOSIS — E7800 Pure hypercholesterolemia, unspecified: Secondary | ICD-10-CM | POA: Diagnosis not present

## 2020-07-10 DIAGNOSIS — Z79899 Other long term (current) drug therapy: Secondary | ICD-10-CM | POA: Diagnosis not present

## 2020-07-10 DIAGNOSIS — R0600 Dyspnea, unspecified: Secondary | ICD-10-CM | POA: Diagnosis not present

## 2020-07-11 DIAGNOSIS — K7689 Other specified diseases of liver: Secondary | ICD-10-CM | POA: Diagnosis not present

## 2020-07-11 DIAGNOSIS — K8020 Calculus of gallbladder without cholecystitis without obstruction: Secondary | ICD-10-CM | POA: Diagnosis not present

## 2020-07-11 DIAGNOSIS — K7460 Unspecified cirrhosis of liver: Secondary | ICD-10-CM | POA: Diagnosis not present

## 2020-07-11 DIAGNOSIS — K5520 Angiodysplasia of colon without hemorrhage: Secondary | ICD-10-CM | POA: Diagnosis not present

## 2020-07-16 DIAGNOSIS — I480 Paroxysmal atrial fibrillation: Secondary | ICD-10-CM | POA: Diagnosis not present

## 2020-07-16 DIAGNOSIS — E611 Iron deficiency: Secondary | ICD-10-CM | POA: Diagnosis not present

## 2020-07-16 DIAGNOSIS — Z0000 Encounter for general adult medical examination without abnormal findings: Secondary | ICD-10-CM | POA: Diagnosis not present

## 2020-07-16 DIAGNOSIS — D6869 Other thrombophilia: Secondary | ICD-10-CM | POA: Diagnosis not present

## 2020-07-16 DIAGNOSIS — J411 Mucopurulent chronic bronchitis: Secondary | ICD-10-CM | POA: Diagnosis not present

## 2020-07-16 DIAGNOSIS — D6489 Other specified anemias: Secondary | ICD-10-CM | POA: Diagnosis not present

## 2020-07-16 DIAGNOSIS — D500 Iron deficiency anemia secondary to blood loss (chronic): Secondary | ICD-10-CM | POA: Diagnosis not present

## 2020-07-16 DIAGNOSIS — Z9981 Dependence on supplemental oxygen: Secondary | ICD-10-CM | POA: Diagnosis not present

## 2020-07-16 DIAGNOSIS — J9611 Chronic respiratory failure with hypoxia: Secondary | ICD-10-CM | POA: Diagnosis not present

## 2020-07-17 DIAGNOSIS — I10 Essential (primary) hypertension: Secondary | ICD-10-CM | POA: Diagnosis not present

## 2020-07-17 DIAGNOSIS — I480 Paroxysmal atrial fibrillation: Secondary | ICD-10-CM | POA: Diagnosis not present

## 2020-07-17 DIAGNOSIS — I2510 Atherosclerotic heart disease of native coronary artery without angina pectoris: Secondary | ICD-10-CM | POA: Diagnosis not present

## 2020-07-17 DIAGNOSIS — I25110 Atherosclerotic heart disease of native coronary artery with unstable angina pectoris: Secondary | ICD-10-CM | POA: Diagnosis not present

## 2020-07-17 DIAGNOSIS — K7460 Unspecified cirrhosis of liver: Secondary | ICD-10-CM | POA: Diagnosis not present

## 2020-07-18 DIAGNOSIS — G4734 Idiopathic sleep related nonobstructive alveolar hypoventilation: Secondary | ICD-10-CM | POA: Diagnosis not present

## 2020-07-18 DIAGNOSIS — R0902 Hypoxemia: Secondary | ICD-10-CM | POA: Diagnosis not present

## 2020-07-18 DIAGNOSIS — M810 Age-related osteoporosis without current pathological fracture: Secondary | ICD-10-CM | POA: Diagnosis not present

## 2020-07-19 DIAGNOSIS — J449 Chronic obstructive pulmonary disease, unspecified: Secondary | ICD-10-CM | POA: Diagnosis not present

## 2020-07-25 DIAGNOSIS — M7051 Other bursitis of knee, right knee: Secondary | ICD-10-CM | POA: Diagnosis not present

## 2020-07-25 DIAGNOSIS — M0609 Rheumatoid arthritis without rheumatoid factor, multiple sites: Secondary | ICD-10-CM | POA: Diagnosis not present

## 2020-07-25 DIAGNOSIS — J449 Chronic obstructive pulmonary disease, unspecified: Secondary | ICD-10-CM | POA: Diagnosis not present

## 2020-07-25 DIAGNOSIS — K7460 Unspecified cirrhosis of liver: Secondary | ICD-10-CM | POA: Diagnosis not present

## 2020-07-26 DIAGNOSIS — B351 Tinea unguium: Secondary | ICD-10-CM | POA: Diagnosis not present

## 2020-07-26 DIAGNOSIS — E1142 Type 2 diabetes mellitus with diabetic polyneuropathy: Secondary | ICD-10-CM | POA: Diagnosis not present

## 2020-07-26 DIAGNOSIS — L84 Corns and callosities: Secondary | ICD-10-CM | POA: Diagnosis not present

## 2020-07-28 DIAGNOSIS — K760 Fatty (change of) liver, not elsewhere classified: Secondary | ICD-10-CM | POA: Diagnosis not present

## 2020-07-28 DIAGNOSIS — R932 Abnormal findings on diagnostic imaging of liver and biliary tract: Secondary | ICD-10-CM | POA: Diagnosis not present

## 2020-07-28 DIAGNOSIS — K769 Liver disease, unspecified: Secondary | ICD-10-CM | POA: Diagnosis not present

## 2020-07-28 DIAGNOSIS — K7460 Unspecified cirrhosis of liver: Secondary | ICD-10-CM | POA: Diagnosis not present

## 2020-08-13 DIAGNOSIS — Z20828 Contact with and (suspected) exposure to other viral communicable diseases: Secondary | ICD-10-CM | POA: Diagnosis not present

## 2020-08-13 DIAGNOSIS — R059 Cough, unspecified: Secondary | ICD-10-CM | POA: Diagnosis not present

## 2020-08-13 DIAGNOSIS — R9431 Abnormal electrocardiogram [ECG] [EKG]: Secondary | ICD-10-CM | POA: Diagnosis not present

## 2020-08-13 DIAGNOSIS — R0600 Dyspnea, unspecified: Secondary | ICD-10-CM | POA: Diagnosis not present

## 2020-08-13 DIAGNOSIS — J189 Pneumonia, unspecified organism: Secondary | ICD-10-CM | POA: Diagnosis not present

## 2020-08-15 DIAGNOSIS — R9431 Abnormal electrocardiogram [ECG] [EKG]: Secondary | ICD-10-CM | POA: Diagnosis not present

## 2020-08-15 DIAGNOSIS — J189 Pneumonia, unspecified organism: Secondary | ICD-10-CM | POA: Diagnosis not present

## 2020-08-17 DIAGNOSIS — M810 Age-related osteoporosis without current pathological fracture: Secondary | ICD-10-CM | POA: Diagnosis not present

## 2020-08-17 DIAGNOSIS — G4734 Idiopathic sleep related nonobstructive alveolar hypoventilation: Secondary | ICD-10-CM | POA: Diagnosis not present

## 2020-08-17 DIAGNOSIS — R0902 Hypoxemia: Secondary | ICD-10-CM | POA: Diagnosis not present

## 2020-08-18 DIAGNOSIS — J449 Chronic obstructive pulmonary disease, unspecified: Secondary | ICD-10-CM | POA: Diagnosis not present

## 2020-08-21 DIAGNOSIS — K7581 Nonalcoholic steatohepatitis (NASH): Secondary | ICD-10-CM | POA: Diagnosis not present

## 2020-08-21 DIAGNOSIS — K7460 Unspecified cirrhosis of liver: Secondary | ICD-10-CM | POA: Diagnosis not present

## 2020-08-21 DIAGNOSIS — K769 Liver disease, unspecified: Secondary | ICD-10-CM | POA: Diagnosis not present

## 2020-08-29 DIAGNOSIS — K7460 Unspecified cirrhosis of liver: Secondary | ICD-10-CM | POA: Diagnosis not present

## 2020-09-12 DIAGNOSIS — I959 Hypotension, unspecified: Secondary | ICD-10-CM | POA: Diagnosis not present

## 2020-09-12 DIAGNOSIS — I4891 Unspecified atrial fibrillation: Secondary | ICD-10-CM | POA: Diagnosis not present

## 2020-09-12 DIAGNOSIS — R531 Weakness: Secondary | ICD-10-CM | POA: Diagnosis not present

## 2020-09-12 DIAGNOSIS — S22079A Unspecified fracture of T9-T10 vertebra, initial encounter for closed fracture: Secondary | ICD-10-CM | POA: Diagnosis not present

## 2020-09-12 DIAGNOSIS — A0472 Enterocolitis due to Clostridium difficile, not specified as recurrent: Secondary | ICD-10-CM | POA: Diagnosis not present

## 2020-09-12 DIAGNOSIS — R918 Other nonspecific abnormal finding of lung field: Secondary | ICD-10-CM | POA: Diagnosis not present

## 2020-09-12 DIAGNOSIS — M4856XA Collapsed vertebra, not elsewhere classified, lumbar region, initial encounter for fracture: Secondary | ICD-10-CM | POA: Diagnosis not present

## 2020-09-12 DIAGNOSIS — R000 Tachycardia, unspecified: Secondary | ICD-10-CM | POA: Diagnosis not present

## 2020-09-12 DIAGNOSIS — M810 Age-related osteoporosis without current pathological fracture: Secondary | ICD-10-CM | POA: Diagnosis not present

## 2020-09-12 DIAGNOSIS — M4854XA Collapsed vertebra, not elsewhere classified, thoracic region, initial encounter for fracture: Secondary | ICD-10-CM | POA: Diagnosis not present

## 2020-09-12 DIAGNOSIS — A4159 Other Gram-negative sepsis: Secondary | ICD-10-CM | POA: Diagnosis not present

## 2020-09-12 DIAGNOSIS — K8020 Calculus of gallbladder without cholecystitis without obstruction: Secondary | ICD-10-CM | POA: Diagnosis not present

## 2020-09-12 DIAGNOSIS — N179 Acute kidney failure, unspecified: Secondary | ICD-10-CM | POA: Diagnosis not present

## 2020-09-12 DIAGNOSIS — S93125A Dislocation of metatarsophalangeal joint of left lesser toe(s), initial encounter: Secondary | ICD-10-CM | POA: Diagnosis not present

## 2020-09-12 DIAGNOSIS — L03116 Cellulitis of left lower limb: Secondary | ICD-10-CM | POA: Diagnosis not present

## 2020-09-12 DIAGNOSIS — R932 Abnormal findings on diagnostic imaging of liver and biliary tract: Secondary | ICD-10-CM | POA: Diagnosis not present

## 2020-09-12 DIAGNOSIS — M2012 Hallux valgus (acquired), left foot: Secondary | ICD-10-CM | POA: Diagnosis not present

## 2020-09-12 DIAGNOSIS — J449 Chronic obstructive pulmonary disease, unspecified: Secondary | ICD-10-CM | POA: Diagnosis not present

## 2020-09-12 DIAGNOSIS — M25572 Pain in left ankle and joints of left foot: Secondary | ICD-10-CM | POA: Diagnosis not present

## 2020-09-12 DIAGNOSIS — A419 Sepsis, unspecified organism: Secondary | ICD-10-CM | POA: Diagnosis not present

## 2020-09-12 DIAGNOSIS — R55 Syncope and collapse: Secondary | ICD-10-CM | POA: Diagnosis not present

## 2020-09-12 DIAGNOSIS — I482 Chronic atrial fibrillation, unspecified: Secondary | ICD-10-CM | POA: Diagnosis not present

## 2020-09-12 DIAGNOSIS — S81802A Unspecified open wound, left lower leg, initial encounter: Secondary | ICD-10-CM | POA: Diagnosis not present

## 2020-09-12 DIAGNOSIS — M7989 Other specified soft tissue disorders: Secondary | ICD-10-CM | POA: Diagnosis not present

## 2020-09-12 DIAGNOSIS — R7881 Bacteremia: Secondary | ICD-10-CM | POA: Diagnosis not present

## 2020-09-12 DIAGNOSIS — Z20822 Contact with and (suspected) exposure to covid-19: Secondary | ICD-10-CM | POA: Diagnosis not present

## 2020-09-12 DIAGNOSIS — R0602 Shortness of breath: Secondary | ICD-10-CM | POA: Diagnosis not present

## 2020-09-12 DIAGNOSIS — R0902 Hypoxemia: Secondary | ICD-10-CM | POA: Diagnosis not present

## 2020-09-12 DIAGNOSIS — R609 Edema, unspecified: Secondary | ICD-10-CM | POA: Diagnosis not present

## 2020-09-12 DIAGNOSIS — R600 Localized edema: Secondary | ICD-10-CM | POA: Diagnosis not present

## 2020-09-12 DIAGNOSIS — G4734 Idiopathic sleep related nonobstructive alveolar hypoventilation: Secondary | ICD-10-CM | POA: Diagnosis not present

## 2020-09-12 DIAGNOSIS — R6521 Severe sepsis with septic shock: Secondary | ICD-10-CM | POA: Diagnosis not present

## 2020-09-12 DIAGNOSIS — R0609 Other forms of dyspnea: Secondary | ICD-10-CM | POA: Diagnosis not present

## 2020-09-12 DIAGNOSIS — S32059A Unspecified fracture of fifth lumbar vertebra, initial encounter for closed fracture: Secondary | ICD-10-CM | POA: Diagnosis not present

## 2020-09-12 DIAGNOSIS — S32039A Unspecified fracture of third lumbar vertebra, initial encounter for closed fracture: Secondary | ICD-10-CM | POA: Diagnosis not present

## 2020-09-12 DIAGNOSIS — M79672 Pain in left foot: Secondary | ICD-10-CM | POA: Diagnosis not present

## 2020-09-24 DIAGNOSIS — J9611 Chronic respiratory failure with hypoxia: Secondary | ICD-10-CM | POA: Diagnosis not present

## 2020-09-24 DIAGNOSIS — M4854XD Collapsed vertebra, not elsewhere classified, thoracic region, subsequent encounter for fracture with routine healing: Secondary | ICD-10-CM | POA: Diagnosis not present

## 2020-09-24 DIAGNOSIS — I959 Hypotension, unspecified: Secondary | ICD-10-CM | POA: Diagnosis not present

## 2020-09-24 DIAGNOSIS — E119 Type 2 diabetes mellitus without complications: Secondary | ICD-10-CM | POA: Diagnosis not present

## 2020-09-24 DIAGNOSIS — I10 Essential (primary) hypertension: Secondary | ICD-10-CM | POA: Diagnosis not present

## 2020-09-24 DIAGNOSIS — J449 Chronic obstructive pulmonary disease, unspecified: Secondary | ICD-10-CM | POA: Diagnosis not present

## 2020-09-24 DIAGNOSIS — I480 Paroxysmal atrial fibrillation: Secondary | ICD-10-CM | POA: Diagnosis not present

## 2020-09-24 DIAGNOSIS — L03116 Cellulitis of left lower limb: Secondary | ICD-10-CM | POA: Diagnosis not present

## 2020-09-24 DIAGNOSIS — S81812D Laceration without foreign body, left lower leg, subsequent encounter: Secondary | ICD-10-CM | POA: Diagnosis not present

## 2020-09-26 DIAGNOSIS — I10 Essential (primary) hypertension: Secondary | ICD-10-CM | POA: Diagnosis not present

## 2020-09-26 DIAGNOSIS — E119 Type 2 diabetes mellitus without complications: Secondary | ICD-10-CM | POA: Diagnosis not present

## 2020-09-26 DIAGNOSIS — J449 Chronic obstructive pulmonary disease, unspecified: Secondary | ICD-10-CM | POA: Diagnosis not present

## 2020-09-26 DIAGNOSIS — S81812D Laceration without foreign body, left lower leg, subsequent encounter: Secondary | ICD-10-CM | POA: Diagnosis not present

## 2020-09-26 DIAGNOSIS — I480 Paroxysmal atrial fibrillation: Secondary | ICD-10-CM | POA: Diagnosis not present

## 2020-09-26 DIAGNOSIS — M4854XD Collapsed vertebra, not elsewhere classified, thoracic region, subsequent encounter for fracture with routine healing: Secondary | ICD-10-CM | POA: Diagnosis not present

## 2020-09-26 DIAGNOSIS — I959 Hypotension, unspecified: Secondary | ICD-10-CM | POA: Diagnosis not present

## 2020-09-26 DIAGNOSIS — J9611 Chronic respiratory failure with hypoxia: Secondary | ICD-10-CM | POA: Diagnosis not present

## 2020-09-26 DIAGNOSIS — L03116 Cellulitis of left lower limb: Secondary | ICD-10-CM | POA: Diagnosis not present

## 2020-09-28 DIAGNOSIS — J9611 Chronic respiratory failure with hypoxia: Secondary | ICD-10-CM | POA: Diagnosis not present

## 2020-09-28 DIAGNOSIS — E119 Type 2 diabetes mellitus without complications: Secondary | ICD-10-CM | POA: Diagnosis not present

## 2020-09-28 DIAGNOSIS — M4854XD Collapsed vertebra, not elsewhere classified, thoracic region, subsequent encounter for fracture with routine healing: Secondary | ICD-10-CM | POA: Diagnosis not present

## 2020-09-28 DIAGNOSIS — I10 Essential (primary) hypertension: Secondary | ICD-10-CM | POA: Diagnosis not present

## 2020-09-28 DIAGNOSIS — J449 Chronic obstructive pulmonary disease, unspecified: Secondary | ICD-10-CM | POA: Diagnosis not present

## 2020-09-28 DIAGNOSIS — I480 Paroxysmal atrial fibrillation: Secondary | ICD-10-CM | POA: Diagnosis not present

## 2020-09-28 DIAGNOSIS — S81812D Laceration without foreign body, left lower leg, subsequent encounter: Secondary | ICD-10-CM | POA: Diagnosis not present

## 2020-09-28 DIAGNOSIS — L03116 Cellulitis of left lower limb: Secondary | ICD-10-CM | POA: Diagnosis not present

## 2020-09-28 DIAGNOSIS — I959 Hypotension, unspecified: Secondary | ICD-10-CM | POA: Diagnosis not present

## 2020-10-01 DIAGNOSIS — L97929 Non-pressure chronic ulcer of unspecified part of left lower leg with unspecified severity: Secondary | ICD-10-CM | POA: Diagnosis not present

## 2020-10-01 DIAGNOSIS — E7800 Pure hypercholesterolemia, unspecified: Secondary | ICD-10-CM | POA: Diagnosis not present

## 2020-10-01 DIAGNOSIS — J9611 Chronic respiratory failure with hypoxia: Secondary | ICD-10-CM | POA: Diagnosis not present

## 2020-10-01 DIAGNOSIS — J411 Mucopurulent chronic bronchitis: Secondary | ICD-10-CM | POA: Diagnosis not present

## 2020-10-01 DIAGNOSIS — E1169 Type 2 diabetes mellitus with other specified complication: Secondary | ICD-10-CM | POA: Diagnosis not present

## 2020-10-01 DIAGNOSIS — E1122 Type 2 diabetes mellitus with diabetic chronic kidney disease: Secondary | ICD-10-CM | POA: Diagnosis not present

## 2020-10-01 DIAGNOSIS — Z9981 Dependence on supplemental oxygen: Secondary | ICD-10-CM | POA: Diagnosis not present

## 2020-10-01 DIAGNOSIS — N1832 Chronic kidney disease, stage 3b: Secondary | ICD-10-CM | POA: Diagnosis not present

## 2020-10-01 DIAGNOSIS — I83029 Varicose veins of left lower extremity with ulcer of unspecified site: Secondary | ICD-10-CM | POA: Diagnosis not present

## 2020-10-03 DIAGNOSIS — I480 Paroxysmal atrial fibrillation: Secondary | ICD-10-CM | POA: Diagnosis not present

## 2020-10-03 DIAGNOSIS — J9611 Chronic respiratory failure with hypoxia: Secondary | ICD-10-CM | POA: Diagnosis not present

## 2020-10-03 DIAGNOSIS — S81812D Laceration without foreign body, left lower leg, subsequent encounter: Secondary | ICD-10-CM | POA: Diagnosis not present

## 2020-10-03 DIAGNOSIS — E119 Type 2 diabetes mellitus without complications: Secondary | ICD-10-CM | POA: Diagnosis not present

## 2020-10-03 DIAGNOSIS — I10 Essential (primary) hypertension: Secondary | ICD-10-CM | POA: Diagnosis not present

## 2020-10-03 DIAGNOSIS — I959 Hypotension, unspecified: Secondary | ICD-10-CM | POA: Diagnosis not present

## 2020-10-03 DIAGNOSIS — J449 Chronic obstructive pulmonary disease, unspecified: Secondary | ICD-10-CM | POA: Diagnosis not present

## 2020-10-03 DIAGNOSIS — M4854XD Collapsed vertebra, not elsewhere classified, thoracic region, subsequent encounter for fracture with routine healing: Secondary | ICD-10-CM | POA: Diagnosis not present

## 2020-10-03 DIAGNOSIS — L03116 Cellulitis of left lower limb: Secondary | ICD-10-CM | POA: Diagnosis not present

## 2020-10-04 DIAGNOSIS — M6281 Muscle weakness (generalized): Secondary | ICD-10-CM | POA: Diagnosis not present

## 2020-10-05 DIAGNOSIS — J449 Chronic obstructive pulmonary disease, unspecified: Secondary | ICD-10-CM | POA: Diagnosis not present

## 2020-10-05 DIAGNOSIS — I480 Paroxysmal atrial fibrillation: Secondary | ICD-10-CM | POA: Diagnosis not present

## 2020-10-05 DIAGNOSIS — J9611 Chronic respiratory failure with hypoxia: Secondary | ICD-10-CM | POA: Diagnosis not present

## 2020-10-05 DIAGNOSIS — L03116 Cellulitis of left lower limb: Secondary | ICD-10-CM | POA: Diagnosis not present

## 2020-10-05 DIAGNOSIS — E119 Type 2 diabetes mellitus without complications: Secondary | ICD-10-CM | POA: Diagnosis not present

## 2020-10-05 DIAGNOSIS — M4854XD Collapsed vertebra, not elsewhere classified, thoracic region, subsequent encounter for fracture with routine healing: Secondary | ICD-10-CM | POA: Diagnosis not present

## 2020-10-05 DIAGNOSIS — I10 Essential (primary) hypertension: Secondary | ICD-10-CM | POA: Diagnosis not present

## 2020-10-05 DIAGNOSIS — I959 Hypotension, unspecified: Secondary | ICD-10-CM | POA: Diagnosis not present

## 2020-10-05 DIAGNOSIS — S81812D Laceration without foreign body, left lower leg, subsequent encounter: Secondary | ICD-10-CM | POA: Diagnosis not present

## 2020-10-08 DIAGNOSIS — L97822 Non-pressure chronic ulcer of other part of left lower leg with fat layer exposed: Secondary | ICD-10-CM | POA: Diagnosis not present

## 2020-10-08 DIAGNOSIS — I87312 Chronic venous hypertension (idiopathic) with ulcer of left lower extremity: Secondary | ICD-10-CM | POA: Diagnosis not present

## 2020-10-08 DIAGNOSIS — I890 Lymphedema, not elsewhere classified: Secondary | ICD-10-CM | POA: Diagnosis not present

## 2020-10-08 DIAGNOSIS — Z7984 Long term (current) use of oral hypoglycemic drugs: Secondary | ICD-10-CM | POA: Diagnosis not present

## 2020-10-08 DIAGNOSIS — L97222 Non-pressure chronic ulcer of left calf with fat layer exposed: Secondary | ICD-10-CM | POA: Diagnosis not present

## 2020-10-08 DIAGNOSIS — E11622 Type 2 diabetes mellitus with other skin ulcer: Secondary | ICD-10-CM | POA: Diagnosis not present

## 2020-10-10 DIAGNOSIS — I959 Hypotension, unspecified: Secondary | ICD-10-CM | POA: Diagnosis not present

## 2020-10-10 DIAGNOSIS — E119 Type 2 diabetes mellitus without complications: Secondary | ICD-10-CM | POA: Diagnosis not present

## 2020-10-10 DIAGNOSIS — J9611 Chronic respiratory failure with hypoxia: Secondary | ICD-10-CM | POA: Diagnosis not present

## 2020-10-10 DIAGNOSIS — M4854XD Collapsed vertebra, not elsewhere classified, thoracic region, subsequent encounter for fracture with routine healing: Secondary | ICD-10-CM | POA: Diagnosis not present

## 2020-10-10 DIAGNOSIS — J449 Chronic obstructive pulmonary disease, unspecified: Secondary | ICD-10-CM | POA: Diagnosis not present

## 2020-10-10 DIAGNOSIS — I480 Paroxysmal atrial fibrillation: Secondary | ICD-10-CM | POA: Diagnosis not present

## 2020-10-10 DIAGNOSIS — S81812D Laceration without foreign body, left lower leg, subsequent encounter: Secondary | ICD-10-CM | POA: Diagnosis not present

## 2020-10-10 DIAGNOSIS — I10 Essential (primary) hypertension: Secondary | ICD-10-CM | POA: Diagnosis not present

## 2020-10-10 DIAGNOSIS — L03116 Cellulitis of left lower limb: Secondary | ICD-10-CM | POA: Diagnosis not present

## 2020-10-11 DIAGNOSIS — J9611 Chronic respiratory failure with hypoxia: Secondary | ICD-10-CM | POA: Diagnosis not present

## 2020-10-11 DIAGNOSIS — I959 Hypotension, unspecified: Secondary | ICD-10-CM | POA: Diagnosis not present

## 2020-10-11 DIAGNOSIS — J449 Chronic obstructive pulmonary disease, unspecified: Secondary | ICD-10-CM | POA: Diagnosis not present

## 2020-10-11 DIAGNOSIS — M4854XD Collapsed vertebra, not elsewhere classified, thoracic region, subsequent encounter for fracture with routine healing: Secondary | ICD-10-CM | POA: Diagnosis not present

## 2020-10-11 DIAGNOSIS — I10 Essential (primary) hypertension: Secondary | ICD-10-CM | POA: Diagnosis not present

## 2020-10-11 DIAGNOSIS — L03116 Cellulitis of left lower limb: Secondary | ICD-10-CM | POA: Diagnosis not present

## 2020-10-11 DIAGNOSIS — I480 Paroxysmal atrial fibrillation: Secondary | ICD-10-CM | POA: Diagnosis not present

## 2020-10-11 DIAGNOSIS — E119 Type 2 diabetes mellitus without complications: Secondary | ICD-10-CM | POA: Diagnosis not present

## 2020-10-11 DIAGNOSIS — S81812D Laceration without foreign body, left lower leg, subsequent encounter: Secondary | ICD-10-CM | POA: Diagnosis not present

## 2020-10-12 DIAGNOSIS — E119 Type 2 diabetes mellitus without complications: Secondary | ICD-10-CM | POA: Diagnosis not present

## 2020-10-12 DIAGNOSIS — I480 Paroxysmal atrial fibrillation: Secondary | ICD-10-CM | POA: Diagnosis not present

## 2020-10-12 DIAGNOSIS — I10 Essential (primary) hypertension: Secondary | ICD-10-CM | POA: Diagnosis not present

## 2020-10-12 DIAGNOSIS — J449 Chronic obstructive pulmonary disease, unspecified: Secondary | ICD-10-CM | POA: Diagnosis not present

## 2020-10-12 DIAGNOSIS — J9611 Chronic respiratory failure with hypoxia: Secondary | ICD-10-CM | POA: Diagnosis not present

## 2020-10-12 DIAGNOSIS — M4854XD Collapsed vertebra, not elsewhere classified, thoracic region, subsequent encounter for fracture with routine healing: Secondary | ICD-10-CM | POA: Diagnosis not present

## 2020-10-12 DIAGNOSIS — I959 Hypotension, unspecified: Secondary | ICD-10-CM | POA: Diagnosis not present

## 2020-10-12 DIAGNOSIS — L03116 Cellulitis of left lower limb: Secondary | ICD-10-CM | POA: Diagnosis not present

## 2020-10-12 DIAGNOSIS — S81812D Laceration without foreign body, left lower leg, subsequent encounter: Secondary | ICD-10-CM | POA: Diagnosis not present

## 2020-10-16 DIAGNOSIS — Z9981 Dependence on supplemental oxygen: Secondary | ICD-10-CM | POA: Diagnosis not present

## 2020-10-16 DIAGNOSIS — N1832 Chronic kidney disease, stage 3b: Secondary | ICD-10-CM | POA: Diagnosis not present

## 2020-10-16 DIAGNOSIS — E1169 Type 2 diabetes mellitus with other specified complication: Secondary | ICD-10-CM | POA: Diagnosis not present

## 2020-10-16 DIAGNOSIS — F411 Generalized anxiety disorder: Secondary | ICD-10-CM | POA: Diagnosis not present

## 2020-10-16 DIAGNOSIS — J411 Mucopurulent chronic bronchitis: Secondary | ICD-10-CM | POA: Diagnosis not present

## 2020-10-16 DIAGNOSIS — E7800 Pure hypercholesterolemia, unspecified: Secondary | ICD-10-CM | POA: Diagnosis not present

## 2020-10-16 DIAGNOSIS — J9611 Chronic respiratory failure with hypoxia: Secondary | ICD-10-CM | POA: Diagnosis not present

## 2020-10-16 DIAGNOSIS — E1122 Type 2 diabetes mellitus with diabetic chronic kidney disease: Secondary | ICD-10-CM | POA: Diagnosis not present

## 2020-10-16 DIAGNOSIS — N183 Chronic kidney disease, stage 3 unspecified: Secondary | ICD-10-CM | POA: Diagnosis not present

## 2020-10-17 DIAGNOSIS — L03116 Cellulitis of left lower limb: Secondary | ICD-10-CM | POA: Diagnosis not present

## 2020-10-17 DIAGNOSIS — I959 Hypotension, unspecified: Secondary | ICD-10-CM | POA: Diagnosis not present

## 2020-10-17 DIAGNOSIS — I480 Paroxysmal atrial fibrillation: Secondary | ICD-10-CM | POA: Diagnosis not present

## 2020-10-17 DIAGNOSIS — I10 Essential (primary) hypertension: Secondary | ICD-10-CM | POA: Diagnosis not present

## 2020-10-17 DIAGNOSIS — S81812D Laceration without foreign body, left lower leg, subsequent encounter: Secondary | ICD-10-CM | POA: Diagnosis not present

## 2020-10-17 DIAGNOSIS — M4854XD Collapsed vertebra, not elsewhere classified, thoracic region, subsequent encounter for fracture with routine healing: Secondary | ICD-10-CM | POA: Diagnosis not present

## 2020-10-17 DIAGNOSIS — J449 Chronic obstructive pulmonary disease, unspecified: Secondary | ICD-10-CM | POA: Diagnosis not present

## 2020-10-17 DIAGNOSIS — J9611 Chronic respiratory failure with hypoxia: Secondary | ICD-10-CM | POA: Diagnosis not present

## 2020-10-17 DIAGNOSIS — E119 Type 2 diabetes mellitus without complications: Secondary | ICD-10-CM | POA: Diagnosis not present

## 2020-10-18 DIAGNOSIS — R0902 Hypoxemia: Secondary | ICD-10-CM | POA: Diagnosis not present

## 2020-10-18 DIAGNOSIS — M810 Age-related osteoporosis without current pathological fracture: Secondary | ICD-10-CM | POA: Diagnosis not present

## 2020-10-18 DIAGNOSIS — G4734 Idiopathic sleep related nonobstructive alveolar hypoventilation: Secondary | ICD-10-CM | POA: Diagnosis not present

## 2020-10-19 DIAGNOSIS — I890 Lymphedema, not elsewhere classified: Secondary | ICD-10-CM | POA: Diagnosis not present

## 2020-10-19 DIAGNOSIS — I87311 Chronic venous hypertension (idiopathic) with ulcer of right lower extremity: Secondary | ICD-10-CM | POA: Diagnosis not present

## 2020-10-19 DIAGNOSIS — E11622 Type 2 diabetes mellitus with other skin ulcer: Secondary | ICD-10-CM | POA: Diagnosis not present

## 2020-10-19 DIAGNOSIS — J449 Chronic obstructive pulmonary disease, unspecified: Secondary | ICD-10-CM | POA: Diagnosis not present

## 2020-10-19 DIAGNOSIS — L97822 Non-pressure chronic ulcer of other part of left lower leg with fat layer exposed: Secondary | ICD-10-CM | POA: Diagnosis not present

## 2020-10-22 DIAGNOSIS — E11622 Type 2 diabetes mellitus with other skin ulcer: Secondary | ICD-10-CM | POA: Diagnosis not present

## 2020-10-22 DIAGNOSIS — I739 Peripheral vascular disease, unspecified: Secondary | ICD-10-CM | POA: Diagnosis not present

## 2020-10-22 DIAGNOSIS — L97222 Non-pressure chronic ulcer of left calf with fat layer exposed: Secondary | ICD-10-CM | POA: Diagnosis not present

## 2020-10-22 DIAGNOSIS — I890 Lymphedema, not elsewhere classified: Secondary | ICD-10-CM | POA: Diagnosis not present

## 2020-10-22 DIAGNOSIS — L97822 Non-pressure chronic ulcer of other part of left lower leg with fat layer exposed: Secondary | ICD-10-CM | POA: Diagnosis not present

## 2020-10-22 DIAGNOSIS — I87312 Chronic venous hypertension (idiopathic) with ulcer of left lower extremity: Secondary | ICD-10-CM | POA: Diagnosis not present

## 2020-10-24 DIAGNOSIS — S81812D Laceration without foreign body, left lower leg, subsequent encounter: Secondary | ICD-10-CM | POA: Diagnosis not present

## 2020-10-24 DIAGNOSIS — I10 Essential (primary) hypertension: Secondary | ICD-10-CM | POA: Diagnosis not present

## 2020-10-24 DIAGNOSIS — I480 Paroxysmal atrial fibrillation: Secondary | ICD-10-CM | POA: Diagnosis not present

## 2020-10-24 DIAGNOSIS — M4854XD Collapsed vertebra, not elsewhere classified, thoracic region, subsequent encounter for fracture with routine healing: Secondary | ICD-10-CM | POA: Diagnosis not present

## 2020-10-24 DIAGNOSIS — E119 Type 2 diabetes mellitus without complications: Secondary | ICD-10-CM | POA: Diagnosis not present

## 2020-10-24 DIAGNOSIS — L03116 Cellulitis of left lower limb: Secondary | ICD-10-CM | POA: Diagnosis not present

## 2020-10-24 DIAGNOSIS — J449 Chronic obstructive pulmonary disease, unspecified: Secondary | ICD-10-CM | POA: Diagnosis not present

## 2020-10-24 DIAGNOSIS — J9611 Chronic respiratory failure with hypoxia: Secondary | ICD-10-CM | POA: Diagnosis not present

## 2020-10-24 DIAGNOSIS — I959 Hypotension, unspecified: Secondary | ICD-10-CM | POA: Diagnosis not present

## 2020-10-25 DIAGNOSIS — I959 Hypotension, unspecified: Secondary | ICD-10-CM | POA: Diagnosis not present

## 2020-10-25 DIAGNOSIS — I480 Paroxysmal atrial fibrillation: Secondary | ICD-10-CM | POA: Diagnosis not present

## 2020-10-25 DIAGNOSIS — E119 Type 2 diabetes mellitus without complications: Secondary | ICD-10-CM | POA: Diagnosis not present

## 2020-10-25 DIAGNOSIS — M4854XD Collapsed vertebra, not elsewhere classified, thoracic region, subsequent encounter for fracture with routine healing: Secondary | ICD-10-CM | POA: Diagnosis not present

## 2020-10-25 DIAGNOSIS — I10 Essential (primary) hypertension: Secondary | ICD-10-CM | POA: Diagnosis not present

## 2020-10-25 DIAGNOSIS — J449 Chronic obstructive pulmonary disease, unspecified: Secondary | ICD-10-CM | POA: Diagnosis not present

## 2020-10-25 DIAGNOSIS — S81812D Laceration without foreign body, left lower leg, subsequent encounter: Secondary | ICD-10-CM | POA: Diagnosis not present

## 2020-10-25 DIAGNOSIS — L03116 Cellulitis of left lower limb: Secondary | ICD-10-CM | POA: Diagnosis not present

## 2020-10-25 DIAGNOSIS — J9611 Chronic respiratory failure with hypoxia: Secondary | ICD-10-CM | POA: Diagnosis not present

## 2020-10-29 DIAGNOSIS — E1151 Type 2 diabetes mellitus with diabetic peripheral angiopathy without gangrene: Secondary | ICD-10-CM | POA: Diagnosis not present

## 2020-10-29 DIAGNOSIS — Z794 Long term (current) use of insulin: Secondary | ICD-10-CM | POA: Diagnosis not present

## 2020-10-29 DIAGNOSIS — I890 Lymphedema, not elsewhere classified: Secondary | ICD-10-CM | POA: Diagnosis not present

## 2020-10-29 DIAGNOSIS — I87312 Chronic venous hypertension (idiopathic) with ulcer of left lower extremity: Secondary | ICD-10-CM | POA: Diagnosis not present

## 2020-10-29 DIAGNOSIS — L97222 Non-pressure chronic ulcer of left calf with fat layer exposed: Secondary | ICD-10-CM | POA: Diagnosis not present

## 2020-10-29 DIAGNOSIS — L97822 Non-pressure chronic ulcer of other part of left lower leg with fat layer exposed: Secondary | ICD-10-CM | POA: Diagnosis not present

## 2020-10-29 DIAGNOSIS — E11622 Type 2 diabetes mellitus with other skin ulcer: Secondary | ICD-10-CM | POA: Diagnosis not present

## 2020-10-30 DIAGNOSIS — E119 Type 2 diabetes mellitus without complications: Secondary | ICD-10-CM | POA: Diagnosis not present

## 2020-10-30 DIAGNOSIS — I480 Paroxysmal atrial fibrillation: Secondary | ICD-10-CM | POA: Diagnosis not present

## 2020-10-30 DIAGNOSIS — S81812D Laceration without foreign body, left lower leg, subsequent encounter: Secondary | ICD-10-CM | POA: Diagnosis not present

## 2020-10-30 DIAGNOSIS — J9611 Chronic respiratory failure with hypoxia: Secondary | ICD-10-CM | POA: Diagnosis not present

## 2020-10-30 DIAGNOSIS — I959 Hypotension, unspecified: Secondary | ICD-10-CM | POA: Diagnosis not present

## 2020-10-30 DIAGNOSIS — M4854XD Collapsed vertebra, not elsewhere classified, thoracic region, subsequent encounter for fracture with routine healing: Secondary | ICD-10-CM | POA: Diagnosis not present

## 2020-10-30 DIAGNOSIS — J449 Chronic obstructive pulmonary disease, unspecified: Secondary | ICD-10-CM | POA: Diagnosis not present

## 2020-10-30 DIAGNOSIS — I10 Essential (primary) hypertension: Secondary | ICD-10-CM | POA: Diagnosis not present

## 2020-10-30 DIAGNOSIS — L03116 Cellulitis of left lower limb: Secondary | ICD-10-CM | POA: Diagnosis not present

## 2020-10-31 DIAGNOSIS — I959 Hypotension, unspecified: Secondary | ICD-10-CM | POA: Diagnosis not present

## 2020-10-31 DIAGNOSIS — E119 Type 2 diabetes mellitus without complications: Secondary | ICD-10-CM | POA: Diagnosis not present

## 2020-10-31 DIAGNOSIS — S81812D Laceration without foreign body, left lower leg, subsequent encounter: Secondary | ICD-10-CM | POA: Diagnosis not present

## 2020-10-31 DIAGNOSIS — I10 Essential (primary) hypertension: Secondary | ICD-10-CM | POA: Diagnosis not present

## 2020-10-31 DIAGNOSIS — M4854XD Collapsed vertebra, not elsewhere classified, thoracic region, subsequent encounter for fracture with routine healing: Secondary | ICD-10-CM | POA: Diagnosis not present

## 2020-10-31 DIAGNOSIS — J449 Chronic obstructive pulmonary disease, unspecified: Secondary | ICD-10-CM | POA: Diagnosis not present

## 2020-10-31 DIAGNOSIS — J9611 Chronic respiratory failure with hypoxia: Secondary | ICD-10-CM | POA: Diagnosis not present

## 2020-10-31 DIAGNOSIS — L03116 Cellulitis of left lower limb: Secondary | ICD-10-CM | POA: Diagnosis not present

## 2020-10-31 DIAGNOSIS — I480 Paroxysmal atrial fibrillation: Secondary | ICD-10-CM | POA: Diagnosis not present

## 2020-11-01 DIAGNOSIS — I10 Essential (primary) hypertension: Secondary | ICD-10-CM | POA: Diagnosis not present

## 2020-11-01 DIAGNOSIS — L03116 Cellulitis of left lower limb: Secondary | ICD-10-CM | POA: Diagnosis not present

## 2020-11-01 DIAGNOSIS — J9611 Chronic respiratory failure with hypoxia: Secondary | ICD-10-CM | POA: Diagnosis not present

## 2020-11-01 DIAGNOSIS — M4854XD Collapsed vertebra, not elsewhere classified, thoracic region, subsequent encounter for fracture with routine healing: Secondary | ICD-10-CM | POA: Diagnosis not present

## 2020-11-01 DIAGNOSIS — I480 Paroxysmal atrial fibrillation: Secondary | ICD-10-CM | POA: Diagnosis not present

## 2020-11-01 DIAGNOSIS — E119 Type 2 diabetes mellitus without complications: Secondary | ICD-10-CM | POA: Diagnosis not present

## 2020-11-01 DIAGNOSIS — J449 Chronic obstructive pulmonary disease, unspecified: Secondary | ICD-10-CM | POA: Diagnosis not present

## 2020-11-01 DIAGNOSIS — I959 Hypotension, unspecified: Secondary | ICD-10-CM | POA: Diagnosis not present

## 2020-11-01 DIAGNOSIS — S81812D Laceration without foreign body, left lower leg, subsequent encounter: Secondary | ICD-10-CM | POA: Diagnosis not present

## 2020-11-12 DIAGNOSIS — J449 Chronic obstructive pulmonary disease, unspecified: Secondary | ICD-10-CM | POA: Diagnosis not present

## 2020-11-12 DIAGNOSIS — L97222 Non-pressure chronic ulcer of left calf with fat layer exposed: Secondary | ICD-10-CM | POA: Diagnosis not present

## 2020-11-12 DIAGNOSIS — I87312 Chronic venous hypertension (idiopathic) with ulcer of left lower extremity: Secondary | ICD-10-CM | POA: Diagnosis not present

## 2020-11-12 DIAGNOSIS — E11622 Type 2 diabetes mellitus with other skin ulcer: Secondary | ICD-10-CM | POA: Diagnosis not present

## 2020-11-12 DIAGNOSIS — E1151 Type 2 diabetes mellitus with diabetic peripheral angiopathy without gangrene: Secondary | ICD-10-CM | POA: Diagnosis not present

## 2020-11-12 DIAGNOSIS — I890 Lymphedema, not elsewhere classified: Secondary | ICD-10-CM | POA: Diagnosis not present

## 2020-11-12 DIAGNOSIS — I872 Venous insufficiency (chronic) (peripheral): Secondary | ICD-10-CM | POA: Diagnosis not present

## 2020-11-12 DIAGNOSIS — L97822 Non-pressure chronic ulcer of other part of left lower leg with fat layer exposed: Secondary | ICD-10-CM | POA: Diagnosis not present

## 2020-11-12 DIAGNOSIS — I2510 Atherosclerotic heart disease of native coronary artery without angina pectoris: Secondary | ICD-10-CM | POA: Diagnosis not present

## 2020-11-12 DIAGNOSIS — L97812 Non-pressure chronic ulcer of other part of right lower leg with fat layer exposed: Secondary | ICD-10-CM | POA: Diagnosis not present

## 2020-11-13 DIAGNOSIS — C220 Liver cell carcinoma: Secondary | ICD-10-CM | POA: Diagnosis not present

## 2020-11-14 DIAGNOSIS — M4854XD Collapsed vertebra, not elsewhere classified, thoracic region, subsequent encounter for fracture with routine healing: Secondary | ICD-10-CM | POA: Diagnosis not present

## 2020-11-14 DIAGNOSIS — S81812D Laceration without foreign body, left lower leg, subsequent encounter: Secondary | ICD-10-CM | POA: Diagnosis not present

## 2020-11-14 DIAGNOSIS — I10 Essential (primary) hypertension: Secondary | ICD-10-CM | POA: Diagnosis not present

## 2020-11-14 DIAGNOSIS — J9611 Chronic respiratory failure with hypoxia: Secondary | ICD-10-CM | POA: Diagnosis not present

## 2020-11-14 DIAGNOSIS — I959 Hypotension, unspecified: Secondary | ICD-10-CM | POA: Diagnosis not present

## 2020-11-14 DIAGNOSIS — E119 Type 2 diabetes mellitus without complications: Secondary | ICD-10-CM | POA: Diagnosis not present

## 2020-11-14 DIAGNOSIS — J449 Chronic obstructive pulmonary disease, unspecified: Secondary | ICD-10-CM | POA: Diagnosis not present

## 2020-11-14 DIAGNOSIS — L03116 Cellulitis of left lower limb: Secondary | ICD-10-CM | POA: Diagnosis not present

## 2020-11-14 DIAGNOSIS — I480 Paroxysmal atrial fibrillation: Secondary | ICD-10-CM | POA: Diagnosis not present

## 2020-11-17 DIAGNOSIS — M810 Age-related osteoporosis without current pathological fracture: Secondary | ICD-10-CM | POA: Diagnosis not present

## 2020-11-17 DIAGNOSIS — R0902 Hypoxemia: Secondary | ICD-10-CM | POA: Diagnosis not present

## 2020-11-17 DIAGNOSIS — G4734 Idiopathic sleep related nonobstructive alveolar hypoventilation: Secondary | ICD-10-CM | POA: Diagnosis not present

## 2020-11-18 DIAGNOSIS — J449 Chronic obstructive pulmonary disease, unspecified: Secondary | ICD-10-CM | POA: Diagnosis not present

## 2020-11-22 DIAGNOSIS — J9611 Chronic respiratory failure with hypoxia: Secondary | ICD-10-CM | POA: Diagnosis not present

## 2020-11-22 DIAGNOSIS — I10 Essential (primary) hypertension: Secondary | ICD-10-CM | POA: Diagnosis not present

## 2020-11-22 DIAGNOSIS — L03116 Cellulitis of left lower limb: Secondary | ICD-10-CM | POA: Diagnosis not present

## 2020-11-22 DIAGNOSIS — J449 Chronic obstructive pulmonary disease, unspecified: Secondary | ICD-10-CM | POA: Diagnosis not present

## 2020-11-22 DIAGNOSIS — I480 Paroxysmal atrial fibrillation: Secondary | ICD-10-CM | POA: Diagnosis not present

## 2020-11-22 DIAGNOSIS — E119 Type 2 diabetes mellitus without complications: Secondary | ICD-10-CM | POA: Diagnosis not present

## 2020-11-22 DIAGNOSIS — M4854XD Collapsed vertebra, not elsewhere classified, thoracic region, subsequent encounter for fracture with routine healing: Secondary | ICD-10-CM | POA: Diagnosis not present

## 2020-11-22 DIAGNOSIS — S81812D Laceration without foreign body, left lower leg, subsequent encounter: Secondary | ICD-10-CM | POA: Diagnosis not present

## 2020-11-22 DIAGNOSIS — I959 Hypotension, unspecified: Secondary | ICD-10-CM | POA: Diagnosis not present

## 2020-11-25 DIAGNOSIS — F419 Anxiety disorder, unspecified: Secondary | ICD-10-CM | POA: Diagnosis not present

## 2020-11-25 DIAGNOSIS — J449 Chronic obstructive pulmonary disease, unspecified: Secondary | ICD-10-CM | POA: Diagnosis not present

## 2020-11-25 DIAGNOSIS — E119 Type 2 diabetes mellitus without complications: Secondary | ICD-10-CM | POA: Diagnosis not present

## 2020-11-25 DIAGNOSIS — K7460 Unspecified cirrhosis of liver: Secondary | ICD-10-CM | POA: Diagnosis not present

## 2020-11-25 DIAGNOSIS — M069 Rheumatoid arthritis, unspecified: Secondary | ICD-10-CM | POA: Diagnosis not present

## 2020-11-25 DIAGNOSIS — K760 Fatty (change of) liver, not elsewhere classified: Secondary | ICD-10-CM | POA: Diagnosis not present

## 2020-11-25 DIAGNOSIS — I480 Paroxysmal atrial fibrillation: Secondary | ICD-10-CM | POA: Diagnosis not present

## 2020-11-25 DIAGNOSIS — I10 Essential (primary) hypertension: Secondary | ICD-10-CM | POA: Diagnosis not present

## 2020-11-25 DIAGNOSIS — J9611 Chronic respiratory failure with hypoxia: Secondary | ICD-10-CM | POA: Diagnosis not present

## 2020-11-25 DIAGNOSIS — R040 Epistaxis: Secondary | ICD-10-CM | POA: Diagnosis not present

## 2020-11-27 DIAGNOSIS — J9611 Chronic respiratory failure with hypoxia: Secondary | ICD-10-CM | POA: Diagnosis not present

## 2020-11-27 DIAGNOSIS — E119 Type 2 diabetes mellitus without complications: Secondary | ICD-10-CM | POA: Diagnosis not present

## 2020-11-27 DIAGNOSIS — J449 Chronic obstructive pulmonary disease, unspecified: Secondary | ICD-10-CM | POA: Diagnosis not present

## 2020-11-27 DIAGNOSIS — I10 Essential (primary) hypertension: Secondary | ICD-10-CM | POA: Diagnosis not present

## 2020-11-27 DIAGNOSIS — L97212 Non-pressure chronic ulcer of right calf with fat layer exposed: Secondary | ICD-10-CM | POA: Diagnosis not present

## 2020-11-27 DIAGNOSIS — I480 Paroxysmal atrial fibrillation: Secondary | ICD-10-CM | POA: Diagnosis not present

## 2020-11-27 DIAGNOSIS — I890 Lymphedema, not elsewhere classified: Secondary | ICD-10-CM | POA: Diagnosis not present

## 2020-11-27 DIAGNOSIS — L97222 Non-pressure chronic ulcer of left calf with fat layer exposed: Secondary | ICD-10-CM | POA: Diagnosis not present

## 2020-11-27 DIAGNOSIS — I87313 Chronic venous hypertension (idiopathic) with ulcer of bilateral lower extremity: Secondary | ICD-10-CM | POA: Diagnosis not present

## 2020-11-28 DIAGNOSIS — L97812 Non-pressure chronic ulcer of other part of right lower leg with fat layer exposed: Secondary | ICD-10-CM | POA: Diagnosis not present

## 2020-11-28 DIAGNOSIS — I87313 Chronic venous hypertension (idiopathic) with ulcer of bilateral lower extremity: Secondary | ICD-10-CM | POA: Diagnosis not present

## 2020-11-28 DIAGNOSIS — E11622 Type 2 diabetes mellitus with other skin ulcer: Secondary | ICD-10-CM | POA: Diagnosis not present

## 2020-11-28 DIAGNOSIS — L97822 Non-pressure chronic ulcer of other part of left lower leg with fat layer exposed: Secondary | ICD-10-CM | POA: Diagnosis not present

## 2020-11-28 DIAGNOSIS — I890 Lymphedema, not elsewhere classified: Secondary | ICD-10-CM | POA: Diagnosis not present

## 2020-11-28 DIAGNOSIS — I739 Peripheral vascular disease, unspecified: Secondary | ICD-10-CM | POA: Diagnosis not present

## 2020-11-28 DIAGNOSIS — E1151 Type 2 diabetes mellitus with diabetic peripheral angiopathy without gangrene: Secondary | ICD-10-CM | POA: Diagnosis not present

## 2020-11-29 DIAGNOSIS — D6869 Other thrombophilia: Secondary | ICD-10-CM | POA: Diagnosis not present

## 2020-11-29 DIAGNOSIS — I480 Paroxysmal atrial fibrillation: Secondary | ICD-10-CM | POA: Diagnosis not present

## 2020-11-29 DIAGNOSIS — N1832 Chronic kidney disease, stage 3b: Secondary | ICD-10-CM | POA: Diagnosis not present

## 2020-11-29 DIAGNOSIS — R300 Dysuria: Secondary | ICD-10-CM | POA: Diagnosis not present

## 2020-11-29 DIAGNOSIS — F4321 Adjustment disorder with depressed mood: Secondary | ICD-10-CM | POA: Diagnosis not present

## 2020-11-29 DIAGNOSIS — Z6828 Body mass index (BMI) 28.0-28.9, adult: Secondary | ICD-10-CM | POA: Diagnosis not present

## 2020-11-29 DIAGNOSIS — Z23 Encounter for immunization: Secondary | ICD-10-CM | POA: Diagnosis not present

## 2020-11-30 DIAGNOSIS — I480 Paroxysmal atrial fibrillation: Secondary | ICD-10-CM | POA: Diagnosis not present

## 2020-11-30 DIAGNOSIS — I890 Lymphedema, not elsewhere classified: Secondary | ICD-10-CM | POA: Diagnosis not present

## 2020-11-30 DIAGNOSIS — J449 Chronic obstructive pulmonary disease, unspecified: Secondary | ICD-10-CM | POA: Diagnosis not present

## 2020-11-30 DIAGNOSIS — I10 Essential (primary) hypertension: Secondary | ICD-10-CM | POA: Diagnosis not present

## 2020-11-30 DIAGNOSIS — J9611 Chronic respiratory failure with hypoxia: Secondary | ICD-10-CM | POA: Diagnosis not present

## 2020-11-30 DIAGNOSIS — E119 Type 2 diabetes mellitus without complications: Secondary | ICD-10-CM | POA: Diagnosis not present

## 2020-11-30 DIAGNOSIS — I87313 Chronic venous hypertension (idiopathic) with ulcer of bilateral lower extremity: Secondary | ICD-10-CM | POA: Diagnosis not present

## 2020-11-30 DIAGNOSIS — L97222 Non-pressure chronic ulcer of left calf with fat layer exposed: Secondary | ICD-10-CM | POA: Diagnosis not present

## 2020-11-30 DIAGNOSIS — L97212 Non-pressure chronic ulcer of right calf with fat layer exposed: Secondary | ICD-10-CM | POA: Diagnosis not present

## 2020-12-03 DIAGNOSIS — L97212 Non-pressure chronic ulcer of right calf with fat layer exposed: Secondary | ICD-10-CM | POA: Diagnosis not present

## 2020-12-03 DIAGNOSIS — I480 Paroxysmal atrial fibrillation: Secondary | ICD-10-CM | POA: Diagnosis not present

## 2020-12-03 DIAGNOSIS — I10 Essential (primary) hypertension: Secondary | ICD-10-CM | POA: Diagnosis not present

## 2020-12-03 DIAGNOSIS — J9611 Chronic respiratory failure with hypoxia: Secondary | ICD-10-CM | POA: Diagnosis not present

## 2020-12-03 DIAGNOSIS — I87313 Chronic venous hypertension (idiopathic) with ulcer of bilateral lower extremity: Secondary | ICD-10-CM | POA: Diagnosis not present

## 2020-12-03 DIAGNOSIS — E119 Type 2 diabetes mellitus without complications: Secondary | ICD-10-CM | POA: Diagnosis not present

## 2020-12-03 DIAGNOSIS — L97222 Non-pressure chronic ulcer of left calf with fat layer exposed: Secondary | ICD-10-CM | POA: Diagnosis not present

## 2020-12-03 DIAGNOSIS — J449 Chronic obstructive pulmonary disease, unspecified: Secondary | ICD-10-CM | POA: Diagnosis not present

## 2020-12-03 DIAGNOSIS — I890 Lymphedema, not elsewhere classified: Secondary | ICD-10-CM | POA: Diagnosis not present

## 2020-12-05 DIAGNOSIS — E1151 Type 2 diabetes mellitus with diabetic peripheral angiopathy without gangrene: Secondary | ICD-10-CM | POA: Diagnosis not present

## 2020-12-05 DIAGNOSIS — I890 Lymphedema, not elsewhere classified: Secondary | ICD-10-CM | POA: Diagnosis not present

## 2020-12-05 DIAGNOSIS — I87313 Chronic venous hypertension (idiopathic) with ulcer of bilateral lower extremity: Secondary | ICD-10-CM | POA: Diagnosis not present

## 2020-12-05 DIAGNOSIS — E11622 Type 2 diabetes mellitus with other skin ulcer: Secondary | ICD-10-CM | POA: Diagnosis not present

## 2020-12-05 DIAGNOSIS — L97812 Non-pressure chronic ulcer of other part of right lower leg with fat layer exposed: Secondary | ICD-10-CM | POA: Diagnosis not present

## 2020-12-05 DIAGNOSIS — L97822 Non-pressure chronic ulcer of other part of left lower leg with fat layer exposed: Secondary | ICD-10-CM | POA: Diagnosis not present

## 2020-12-06 DIAGNOSIS — I2510 Atherosclerotic heart disease of native coronary artery without angina pectoris: Secondary | ICD-10-CM | POA: Diagnosis not present

## 2020-12-06 DIAGNOSIS — B338 Other specified viral diseases: Secondary | ICD-10-CM | POA: Diagnosis not present

## 2020-12-06 DIAGNOSIS — I110 Hypertensive heart disease with heart failure: Secondary | ICD-10-CM | POA: Diagnosis not present

## 2020-12-06 DIAGNOSIS — J441 Chronic obstructive pulmonary disease with (acute) exacerbation: Secondary | ICD-10-CM | POA: Diagnosis not present

## 2020-12-06 DIAGNOSIS — I87313 Chronic venous hypertension (idiopathic) with ulcer of bilateral lower extremity: Secondary | ICD-10-CM | POA: Diagnosis not present

## 2020-12-06 DIAGNOSIS — I2699 Other pulmonary embolism without acute cor pulmonale: Secondary | ICD-10-CM | POA: Diagnosis not present

## 2020-12-06 DIAGNOSIS — F419 Anxiety disorder, unspecified: Secondary | ICD-10-CM | POA: Diagnosis not present

## 2020-12-06 DIAGNOSIS — R062 Wheezing: Secondary | ICD-10-CM | POA: Diagnosis not present

## 2020-12-06 DIAGNOSIS — R069 Unspecified abnormalities of breathing: Secondary | ICD-10-CM | POA: Diagnosis not present

## 2020-12-06 DIAGNOSIS — E785 Hyperlipidemia, unspecified: Secondary | ICD-10-CM | POA: Diagnosis not present

## 2020-12-06 DIAGNOSIS — R0689 Other abnormalities of breathing: Secondary | ICD-10-CM | POA: Diagnosis not present

## 2020-12-06 DIAGNOSIS — R000 Tachycardia, unspecified: Secondary | ICD-10-CM | POA: Diagnosis not present

## 2020-12-06 DIAGNOSIS — B974 Respiratory syncytial virus as the cause of diseases classified elsewhere: Secondary | ICD-10-CM | POA: Diagnosis not present

## 2020-12-06 DIAGNOSIS — E119 Type 2 diabetes mellitus without complications: Secondary | ICD-10-CM | POA: Diagnosis not present

## 2020-12-06 DIAGNOSIS — R0600 Dyspnea, unspecified: Secondary | ICD-10-CM | POA: Diagnosis not present

## 2020-12-06 DIAGNOSIS — R0602 Shortness of breath: Secondary | ICD-10-CM | POA: Diagnosis not present

## 2020-12-06 DIAGNOSIS — I509 Heart failure, unspecified: Secondary | ICD-10-CM | POA: Diagnosis not present

## 2020-12-06 DIAGNOSIS — J439 Emphysema, unspecified: Secondary | ICD-10-CM | POA: Diagnosis not present

## 2020-12-06 DIAGNOSIS — I480 Paroxysmal atrial fibrillation: Secondary | ICD-10-CM | POA: Diagnosis not present

## 2020-12-06 DIAGNOSIS — I491 Atrial premature depolarization: Secondary | ICD-10-CM | POA: Diagnosis not present

## 2020-12-07 DIAGNOSIS — I87313 Chronic venous hypertension (idiopathic) with ulcer of bilateral lower extremity: Secondary | ICD-10-CM | POA: Diagnosis not present

## 2020-12-11 ENCOUNTER — Other Ambulatory Visit: Payer: Self-pay

## 2020-12-11 DIAGNOSIS — I480 Paroxysmal atrial fibrillation: Secondary | ICD-10-CM | POA: Diagnosis not present

## 2020-12-11 DIAGNOSIS — E119 Type 2 diabetes mellitus without complications: Secondary | ICD-10-CM | POA: Diagnosis not present

## 2020-12-11 DIAGNOSIS — J449 Chronic obstructive pulmonary disease, unspecified: Secondary | ICD-10-CM | POA: Diagnosis not present

## 2020-12-11 DIAGNOSIS — J9611 Chronic respiratory failure with hypoxia: Secondary | ICD-10-CM | POA: Diagnosis not present

## 2020-12-11 DIAGNOSIS — I890 Lymphedema, not elsewhere classified: Secondary | ICD-10-CM | POA: Diagnosis not present

## 2020-12-11 DIAGNOSIS — I87313 Chronic venous hypertension (idiopathic) with ulcer of bilateral lower extremity: Secondary | ICD-10-CM | POA: Diagnosis not present

## 2020-12-11 DIAGNOSIS — L97212 Non-pressure chronic ulcer of right calf with fat layer exposed: Secondary | ICD-10-CM | POA: Diagnosis not present

## 2020-12-11 DIAGNOSIS — I10 Essential (primary) hypertension: Secondary | ICD-10-CM | POA: Diagnosis not present

## 2020-12-11 DIAGNOSIS — L97222 Non-pressure chronic ulcer of left calf with fat layer exposed: Secondary | ICD-10-CM | POA: Diagnosis not present

## 2020-12-11 NOTE — Patient Outreach (Signed)
[facility]) Care Management  12/11/2020  Crystal Pham June 09, 1940 [id number]   Referral Date: 12/11/20 Referral Source: PING Date of Discharge: 12/07/20 Facility:  [facility] Insurance: [facility]   Referral received. No outreach warranted at this time. Transition of Care will be completed by primary care provider office who will refer to [facility] care management if needed.  Plan: RN CM will close case.  Bary Leriche, RN, MSN [facility] Care Management Care Management Coordinator Direct Line [phone] Cell [phone] Toll Free: [phone]  Fax: [phone]

## 2020-12-12 DIAGNOSIS — B338 Other specified viral diseases: Secondary | ICD-10-CM | POA: Diagnosis not present

## 2020-12-12 DIAGNOSIS — Z9981 Dependence on supplemental oxygen: Secondary | ICD-10-CM | POA: Diagnosis not present

## 2020-12-12 DIAGNOSIS — J441 Chronic obstructive pulmonary disease with (acute) exacerbation: Secondary | ICD-10-CM | POA: Diagnosis not present

## 2020-12-12 DIAGNOSIS — J9611 Chronic respiratory failure with hypoxia: Secondary | ICD-10-CM | POA: Diagnosis not present

## 2020-12-12 DIAGNOSIS — R0989 Other specified symptoms and signs involving the circulatory and respiratory systems: Secondary | ICD-10-CM | POA: Diagnosis not present

## 2020-12-12 DIAGNOSIS — Z6827 Body mass index (BMI) 27.0-27.9, adult: Secondary | ICD-10-CM | POA: Diagnosis not present

## 2020-12-12 DIAGNOSIS — H6501 Acute serous otitis media, right ear: Secondary | ICD-10-CM | POA: Diagnosis not present

## 2020-12-17 DIAGNOSIS — I10 Essential (primary) hypertension: Secondary | ICD-10-CM | POA: Diagnosis not present

## 2020-12-17 DIAGNOSIS — J9611 Chronic respiratory failure with hypoxia: Secondary | ICD-10-CM | POA: Diagnosis not present

## 2020-12-17 DIAGNOSIS — L97222 Non-pressure chronic ulcer of left calf with fat layer exposed: Secondary | ICD-10-CM | POA: Diagnosis not present

## 2020-12-17 DIAGNOSIS — G8929 Other chronic pain: Secondary | ICD-10-CM | POA: Diagnosis not present

## 2020-12-17 DIAGNOSIS — E7800 Pure hypercholesterolemia, unspecified: Secondary | ICD-10-CM | POA: Diagnosis not present

## 2020-12-17 DIAGNOSIS — I87313 Chronic venous hypertension (idiopathic) with ulcer of bilateral lower extremity: Secondary | ICD-10-CM | POA: Diagnosis not present

## 2020-12-17 DIAGNOSIS — L97212 Non-pressure chronic ulcer of right calf with fat layer exposed: Secondary | ICD-10-CM | POA: Diagnosis not present

## 2020-12-17 DIAGNOSIS — M1991 Primary osteoarthritis, unspecified site: Secondary | ICD-10-CM | POA: Diagnosis not present

## 2020-12-17 DIAGNOSIS — E1169 Type 2 diabetes mellitus with other specified complication: Secondary | ICD-10-CM | POA: Diagnosis not present

## 2020-12-17 DIAGNOSIS — I480 Paroxysmal atrial fibrillation: Secondary | ICD-10-CM | POA: Diagnosis not present

## 2020-12-17 DIAGNOSIS — I700 Atherosclerosis of aorta: Secondary | ICD-10-CM | POA: Diagnosis not present

## 2020-12-17 DIAGNOSIS — I890 Lymphedema, not elsewhere classified: Secondary | ICD-10-CM | POA: Diagnosis not present

## 2020-12-17 DIAGNOSIS — Z9981 Dependence on supplemental oxygen: Secondary | ICD-10-CM | POA: Diagnosis not present

## 2020-12-17 DIAGNOSIS — J411 Mucopurulent chronic bronchitis: Secondary | ICD-10-CM | POA: Diagnosis not present

## 2020-12-17 DIAGNOSIS — E119 Type 2 diabetes mellitus without complications: Secondary | ICD-10-CM | POA: Diagnosis not present

## 2020-12-17 DIAGNOSIS — G720 Drug-induced myopathy: Secondary | ICD-10-CM | POA: Diagnosis not present

## 2020-12-17 DIAGNOSIS — J449 Chronic obstructive pulmonary disease, unspecified: Secondary | ICD-10-CM | POA: Diagnosis not present

## 2020-12-18 DIAGNOSIS — R0902 Hypoxemia: Secondary | ICD-10-CM | POA: Diagnosis not present

## 2020-12-18 DIAGNOSIS — G4734 Idiopathic sleep related nonobstructive alveolar hypoventilation: Secondary | ICD-10-CM | POA: Diagnosis not present

## 2020-12-18 DIAGNOSIS — M810 Age-related osteoporosis without current pathological fracture: Secondary | ICD-10-CM | POA: Diagnosis not present

## 2020-12-19 DIAGNOSIS — I87313 Chronic venous hypertension (idiopathic) with ulcer of bilateral lower extremity: Secondary | ICD-10-CM | POA: Diagnosis not present

## 2020-12-19 DIAGNOSIS — J449 Chronic obstructive pulmonary disease, unspecified: Secondary | ICD-10-CM | POA: Diagnosis not present

## 2020-12-19 DIAGNOSIS — E11622 Type 2 diabetes mellitus with other skin ulcer: Secondary | ICD-10-CM | POA: Diagnosis not present

## 2020-12-19 DIAGNOSIS — E1151 Type 2 diabetes mellitus with diabetic peripheral angiopathy without gangrene: Secondary | ICD-10-CM | POA: Diagnosis not present

## 2020-12-19 DIAGNOSIS — L97822 Non-pressure chronic ulcer of other part of left lower leg with fat layer exposed: Secondary | ICD-10-CM | POA: Diagnosis not present

## 2020-12-19 DIAGNOSIS — L97812 Non-pressure chronic ulcer of other part of right lower leg with fat layer exposed: Secondary | ICD-10-CM | POA: Diagnosis not present

## 2020-12-19 DIAGNOSIS — I890 Lymphedema, not elsewhere classified: Secondary | ICD-10-CM | POA: Diagnosis not present

## 2020-12-24 DIAGNOSIS — K7460 Unspecified cirrhosis of liver: Secondary | ICD-10-CM | POA: Diagnosis not present

## 2020-12-26 DIAGNOSIS — L97812 Non-pressure chronic ulcer of other part of right lower leg with fat layer exposed: Secondary | ICD-10-CM | POA: Diagnosis not present

## 2020-12-26 DIAGNOSIS — E11622 Type 2 diabetes mellitus with other skin ulcer: Secondary | ICD-10-CM | POA: Diagnosis not present

## 2020-12-26 DIAGNOSIS — E1151 Type 2 diabetes mellitus with diabetic peripheral angiopathy without gangrene: Secondary | ICD-10-CM | POA: Diagnosis not present

## 2020-12-26 DIAGNOSIS — I87311 Chronic venous hypertension (idiopathic) with ulcer of right lower extremity: Secondary | ICD-10-CM | POA: Diagnosis not present

## 2020-12-26 DIAGNOSIS — I890 Lymphedema, not elsewhere classified: Secondary | ICD-10-CM | POA: Diagnosis not present

## 2020-12-26 DIAGNOSIS — L97822 Non-pressure chronic ulcer of other part of left lower leg with fat layer exposed: Secondary | ICD-10-CM | POA: Diagnosis not present

## 2020-12-30 DIAGNOSIS — M25511 Pain in right shoulder: Secondary | ICD-10-CM | POA: Diagnosis not present

## 2020-12-30 DIAGNOSIS — R0600 Dyspnea, unspecified: Secondary | ICD-10-CM | POA: Diagnosis not present

## 2021-01-02 DIAGNOSIS — L97812 Non-pressure chronic ulcer of other part of right lower leg with fat layer exposed: Secondary | ICD-10-CM | POA: Diagnosis not present

## 2021-01-02 DIAGNOSIS — L97822 Non-pressure chronic ulcer of other part of left lower leg with fat layer exposed: Secondary | ICD-10-CM | POA: Diagnosis not present

## 2021-01-02 DIAGNOSIS — E11622 Type 2 diabetes mellitus with other skin ulcer: Secondary | ICD-10-CM | POA: Diagnosis not present

## 2021-01-02 DIAGNOSIS — I890 Lymphedema, not elsewhere classified: Secondary | ICD-10-CM | POA: Diagnosis not present

## 2021-01-02 DIAGNOSIS — E1151 Type 2 diabetes mellitus with diabetic peripheral angiopathy without gangrene: Secondary | ICD-10-CM | POA: Diagnosis not present

## 2021-01-02 DIAGNOSIS — I87313 Chronic venous hypertension (idiopathic) with ulcer of bilateral lower extremity: Secondary | ICD-10-CM | POA: Diagnosis not present

## 2021-01-04 DIAGNOSIS — L97212 Non-pressure chronic ulcer of right calf with fat layer exposed: Secondary | ICD-10-CM | POA: Diagnosis not present

## 2021-01-04 DIAGNOSIS — E119 Type 2 diabetes mellitus without complications: Secondary | ICD-10-CM | POA: Diagnosis not present

## 2021-01-04 DIAGNOSIS — I87313 Chronic venous hypertension (idiopathic) with ulcer of bilateral lower extremity: Secondary | ICD-10-CM | POA: Diagnosis not present

## 2021-01-04 DIAGNOSIS — I480 Paroxysmal atrial fibrillation: Secondary | ICD-10-CM | POA: Diagnosis not present

## 2021-01-04 DIAGNOSIS — J449 Chronic obstructive pulmonary disease, unspecified: Secondary | ICD-10-CM | POA: Diagnosis not present

## 2021-01-04 DIAGNOSIS — I890 Lymphedema, not elsewhere classified: Secondary | ICD-10-CM | POA: Diagnosis not present

## 2021-01-04 DIAGNOSIS — J9611 Chronic respiratory failure with hypoxia: Secondary | ICD-10-CM | POA: Diagnosis not present

## 2021-01-04 DIAGNOSIS — I10 Essential (primary) hypertension: Secondary | ICD-10-CM | POA: Diagnosis not present

## 2021-01-04 DIAGNOSIS — L97222 Non-pressure chronic ulcer of left calf with fat layer exposed: Secondary | ICD-10-CM | POA: Diagnosis not present

## 2021-01-08 DIAGNOSIS — Z6827 Body mass index (BMI) 27.0-27.9, adult: Secondary | ICD-10-CM | POA: Diagnosis not present

## 2021-01-08 DIAGNOSIS — M7918 Myalgia, other site: Secondary | ICD-10-CM | POA: Diagnosis not present

## 2021-01-09 DIAGNOSIS — L97822 Non-pressure chronic ulcer of other part of left lower leg with fat layer exposed: Secondary | ICD-10-CM | POA: Diagnosis not present

## 2021-01-09 DIAGNOSIS — I10 Essential (primary) hypertension: Secondary | ICD-10-CM | POA: Diagnosis not present

## 2021-01-09 DIAGNOSIS — L97812 Non-pressure chronic ulcer of other part of right lower leg with fat layer exposed: Secondary | ICD-10-CM | POA: Diagnosis not present

## 2021-01-09 DIAGNOSIS — E1151 Type 2 diabetes mellitus with diabetic peripheral angiopathy without gangrene: Secondary | ICD-10-CM | POA: Diagnosis not present

## 2021-01-09 DIAGNOSIS — I890 Lymphedema, not elsewhere classified: Secondary | ICD-10-CM | POA: Diagnosis not present

## 2021-01-09 DIAGNOSIS — I87313 Chronic venous hypertension (idiopathic) with ulcer of bilateral lower extremity: Secondary | ICD-10-CM | POA: Diagnosis not present

## 2021-01-09 DIAGNOSIS — I4891 Unspecified atrial fibrillation: Secondary | ICD-10-CM | POA: Diagnosis not present

## 2021-01-09 DIAGNOSIS — Z7901 Long term (current) use of anticoagulants: Secondary | ICD-10-CM | POA: Diagnosis not present

## 2021-01-09 DIAGNOSIS — E11622 Type 2 diabetes mellitus with other skin ulcer: Secondary | ICD-10-CM | POA: Diagnosis not present

## 2021-01-10 DIAGNOSIS — I10 Essential (primary) hypertension: Secondary | ICD-10-CM | POA: Diagnosis not present

## 2021-01-10 DIAGNOSIS — C220 Liver cell carcinoma: Secondary | ICD-10-CM | POA: Diagnosis not present

## 2021-01-10 DIAGNOSIS — I2510 Atherosclerotic heart disease of native coronary artery without angina pectoris: Secondary | ICD-10-CM | POA: Diagnosis not present

## 2021-01-10 DIAGNOSIS — I4891 Unspecified atrial fibrillation: Secondary | ICD-10-CM | POA: Diagnosis not present

## 2021-01-10 DIAGNOSIS — K7460 Unspecified cirrhosis of liver: Secondary | ICD-10-CM | POA: Diagnosis not present

## 2021-01-10 DIAGNOSIS — Z01810 Encounter for preprocedural cardiovascular examination: Secondary | ICD-10-CM | POA: Diagnosis not present

## 2021-01-11 DIAGNOSIS — R0600 Dyspnea, unspecified: Secondary | ICD-10-CM | POA: Diagnosis not present

## 2021-01-11 DIAGNOSIS — J441 Chronic obstructive pulmonary disease with (acute) exacerbation: Secondary | ICD-10-CM | POA: Diagnosis not present

## 2021-01-11 DIAGNOSIS — E785 Hyperlipidemia, unspecified: Secondary | ICD-10-CM | POA: Diagnosis not present

## 2021-01-11 DIAGNOSIS — R059 Cough, unspecified: Secondary | ICD-10-CM | POA: Diagnosis not present

## 2021-01-11 DIAGNOSIS — E1165 Type 2 diabetes mellitus with hyperglycemia: Secondary | ICD-10-CM | POA: Diagnosis not present

## 2021-01-11 DIAGNOSIS — I10 Essential (primary) hypertension: Secondary | ICD-10-CM | POA: Diagnosis not present

## 2021-01-11 DIAGNOSIS — C220 Liver cell carcinoma: Secondary | ICD-10-CM | POA: Diagnosis not present

## 2021-01-11 DIAGNOSIS — I129 Hypertensive chronic kidney disease with stage 1 through stage 4 chronic kidney disease, or unspecified chronic kidney disease: Secondary | ICD-10-CM | POA: Diagnosis not present

## 2021-01-11 DIAGNOSIS — I700 Atherosclerosis of aorta: Secondary | ICD-10-CM | POA: Diagnosis not present

## 2021-01-11 DIAGNOSIS — N390 Urinary tract infection, site not specified: Secondary | ICD-10-CM | POA: Diagnosis not present

## 2021-01-11 DIAGNOSIS — Z9981 Dependence on supplemental oxygen: Secondary | ICD-10-CM | POA: Diagnosis not present

## 2021-01-11 DIAGNOSIS — R778 Other specified abnormalities of plasma proteins: Secondary | ICD-10-CM | POA: Diagnosis not present

## 2021-01-11 DIAGNOSIS — I2510 Atherosclerotic heart disease of native coronary artery without angina pectoris: Secondary | ICD-10-CM | POA: Diagnosis not present

## 2021-01-11 DIAGNOSIS — Z20822 Contact with and (suspected) exposure to covid-19: Secondary | ICD-10-CM | POA: Diagnosis not present

## 2021-01-11 DIAGNOSIS — I248 Other forms of acute ischemic heart disease: Secondary | ICD-10-CM | POA: Diagnosis not present

## 2021-01-11 DIAGNOSIS — J9611 Chronic respiratory failure with hypoxia: Secondary | ICD-10-CM | POA: Diagnosis not present

## 2021-01-12 DIAGNOSIS — J449 Chronic obstructive pulmonary disease, unspecified: Secondary | ICD-10-CM | POA: Diagnosis not present

## 2021-01-12 DIAGNOSIS — R778 Other specified abnormalities of plasma proteins: Secondary | ICD-10-CM | POA: Diagnosis not present

## 2021-01-12 DIAGNOSIS — I2510 Atherosclerotic heart disease of native coronary artery without angina pectoris: Secondary | ICD-10-CM | POA: Diagnosis not present

## 2021-01-12 DIAGNOSIS — K7460 Unspecified cirrhosis of liver: Secondary | ICD-10-CM | POA: Diagnosis not present

## 2021-01-12 DIAGNOSIS — I214 Non-ST elevation (NSTEMI) myocardial infarction: Secondary | ICD-10-CM | POA: Diagnosis not present

## 2021-01-12 DIAGNOSIS — I1310 Hypertensive heart and chronic kidney disease without heart failure, with stage 1 through stage 4 chronic kidney disease, or unspecified chronic kidney disease: Secondary | ICD-10-CM | POA: Diagnosis not present

## 2021-01-12 DIAGNOSIS — N183 Chronic kidney disease, stage 3 unspecified: Secondary | ICD-10-CM | POA: Diagnosis not present

## 2021-01-12 DIAGNOSIS — I350 Nonrheumatic aortic (valve) stenosis: Secondary | ICD-10-CM | POA: Diagnosis not present

## 2021-01-12 DIAGNOSIS — E1165 Type 2 diabetes mellitus with hyperglycemia: Secondary | ICD-10-CM | POA: Diagnosis not present

## 2021-01-12 DIAGNOSIS — M4856XA Collapsed vertebra, not elsewhere classified, lumbar region, initial encounter for fracture: Secondary | ICD-10-CM | POA: Diagnosis not present

## 2021-01-12 DIAGNOSIS — M47814 Spondylosis without myelopathy or radiculopathy, thoracic region: Secondary | ICD-10-CM | POA: Diagnosis not present

## 2021-01-12 DIAGNOSIS — J9611 Chronic respiratory failure with hypoxia: Secondary | ICD-10-CM | POA: Diagnosis not present

## 2021-01-12 DIAGNOSIS — I480 Paroxysmal atrial fibrillation: Secondary | ICD-10-CM | POA: Diagnosis not present

## 2021-01-13 DIAGNOSIS — I248 Other forms of acute ischemic heart disease: Secondary | ICD-10-CM | POA: Diagnosis not present

## 2021-01-13 DIAGNOSIS — E039 Hypothyroidism, unspecified: Secondary | ICD-10-CM | POA: Diagnosis not present

## 2021-01-13 DIAGNOSIS — I10 Essential (primary) hypertension: Secondary | ICD-10-CM | POA: Diagnosis not present

## 2021-01-13 DIAGNOSIS — D649 Anemia, unspecified: Secondary | ICD-10-CM | POA: Diagnosis not present

## 2021-01-13 DIAGNOSIS — E785 Hyperlipidemia, unspecified: Secondary | ICD-10-CM | POA: Diagnosis not present

## 2021-01-13 DIAGNOSIS — E1165 Type 2 diabetes mellitus with hyperglycemia: Secondary | ICD-10-CM | POA: Diagnosis not present

## 2021-01-13 DIAGNOSIS — M069 Rheumatoid arthritis, unspecified: Secondary | ICD-10-CM | POA: Diagnosis not present

## 2021-01-13 DIAGNOSIS — I2510 Atherosclerotic heart disease of native coronary artery without angina pectoris: Secondary | ICD-10-CM | POA: Diagnosis not present

## 2021-01-13 DIAGNOSIS — N390 Urinary tract infection, site not specified: Secondary | ICD-10-CM | POA: Diagnosis not present

## 2021-01-13 DIAGNOSIS — R778 Other specified abnormalities of plasma proteins: Secondary | ICD-10-CM | POA: Diagnosis not present

## 2021-01-13 DIAGNOSIS — J9611 Chronic respiratory failure with hypoxia: Secondary | ICD-10-CM | POA: Diagnosis not present

## 2021-01-14 DIAGNOSIS — I2510 Atherosclerotic heart disease of native coronary artery without angina pectoris: Secondary | ICD-10-CM | POA: Diagnosis not present

## 2021-01-14 DIAGNOSIS — M545 Low back pain, unspecified: Secondary | ICD-10-CM | POA: Diagnosis not present

## 2021-01-14 DIAGNOSIS — E119 Type 2 diabetes mellitus without complications: Secondary | ICD-10-CM | POA: Diagnosis not present

## 2021-01-14 DIAGNOSIS — J449 Chronic obstructive pulmonary disease, unspecified: Secondary | ICD-10-CM | POA: Diagnosis not present

## 2021-01-14 DIAGNOSIS — R0902 Hypoxemia: Secondary | ICD-10-CM | POA: Diagnosis not present

## 2021-01-14 DIAGNOSIS — G8929 Other chronic pain: Secondary | ICD-10-CM | POA: Diagnosis not present

## 2021-01-14 DIAGNOSIS — S22079A Unspecified fracture of T9-T10 vertebra, initial encounter for closed fracture: Secondary | ICD-10-CM | POA: Diagnosis not present

## 2021-01-14 DIAGNOSIS — S229XXA Fracture of bony thorax, part unspecified, initial encounter for closed fracture: Secondary | ICD-10-CM | POA: Diagnosis not present

## 2021-01-14 DIAGNOSIS — I959 Hypotension, unspecified: Secondary | ICD-10-CM | POA: Diagnosis not present

## 2021-01-14 DIAGNOSIS — K7460 Unspecified cirrhosis of liver: Secondary | ICD-10-CM | POA: Diagnosis not present

## 2021-01-14 DIAGNOSIS — J9611 Chronic respiratory failure with hypoxia: Secondary | ICD-10-CM | POA: Diagnosis not present

## 2021-01-14 DIAGNOSIS — F419 Anxiety disorder, unspecified: Secondary | ICD-10-CM | POA: Diagnosis not present

## 2021-01-16 DIAGNOSIS — I87313 Chronic venous hypertension (idiopathic) with ulcer of bilateral lower extremity: Secondary | ICD-10-CM | POA: Diagnosis not present

## 2021-01-16 DIAGNOSIS — L97222 Non-pressure chronic ulcer of left calf with fat layer exposed: Secondary | ICD-10-CM | POA: Diagnosis not present

## 2021-01-16 DIAGNOSIS — L97812 Non-pressure chronic ulcer of other part of right lower leg with fat layer exposed: Secondary | ICD-10-CM | POA: Diagnosis not present

## 2021-01-16 DIAGNOSIS — L97822 Non-pressure chronic ulcer of other part of left lower leg with fat layer exposed: Secondary | ICD-10-CM | POA: Diagnosis not present

## 2021-01-16 DIAGNOSIS — I890 Lymphedema, not elsewhere classified: Secondary | ICD-10-CM | POA: Diagnosis not present

## 2021-01-16 DIAGNOSIS — I739 Peripheral vascular disease, unspecified: Secondary | ICD-10-CM | POA: Diagnosis not present

## 2021-01-16 DIAGNOSIS — I4891 Unspecified atrial fibrillation: Secondary | ICD-10-CM | POA: Diagnosis not present

## 2021-01-16 DIAGNOSIS — I87312 Chronic venous hypertension (idiopathic) with ulcer of left lower extremity: Secondary | ICD-10-CM | POA: Diagnosis not present

## 2021-01-16 DIAGNOSIS — I87311 Chronic venous hypertension (idiopathic) with ulcer of right lower extremity: Secondary | ICD-10-CM | POA: Diagnosis not present

## 2021-01-16 DIAGNOSIS — L97212 Non-pressure chronic ulcer of right calf with fat layer exposed: Secondary | ICD-10-CM | POA: Diagnosis not present

## 2021-01-16 DIAGNOSIS — E119 Type 2 diabetes mellitus without complications: Secondary | ICD-10-CM | POA: Diagnosis not present

## 2021-01-16 DIAGNOSIS — J9611 Chronic respiratory failure with hypoxia: Secondary | ICD-10-CM | POA: Diagnosis not present

## 2021-01-16 DIAGNOSIS — E11622 Type 2 diabetes mellitus with other skin ulcer: Secondary | ICD-10-CM | POA: Diagnosis not present

## 2021-01-16 DIAGNOSIS — Z7901 Long term (current) use of anticoagulants: Secondary | ICD-10-CM | POA: Diagnosis not present

## 2021-01-16 DIAGNOSIS — J449 Chronic obstructive pulmonary disease, unspecified: Secondary | ICD-10-CM | POA: Diagnosis not present

## 2021-01-16 DIAGNOSIS — I10 Essential (primary) hypertension: Secondary | ICD-10-CM | POA: Diagnosis not present

## 2021-01-16 DIAGNOSIS — I480 Paroxysmal atrial fibrillation: Secondary | ICD-10-CM | POA: Diagnosis not present

## 2021-01-17 DIAGNOSIS — Z6828 Body mass index (BMI) 28.0-28.9, adult: Secondary | ICD-10-CM | POA: Diagnosis not present

## 2021-01-17 DIAGNOSIS — M1991 Primary osteoarthritis, unspecified site: Secondary | ICD-10-CM | POA: Diagnosis not present

## 2021-01-17 DIAGNOSIS — F331 Major depressive disorder, recurrent, moderate: Secondary | ICD-10-CM | POA: Diagnosis not present

## 2021-01-17 DIAGNOSIS — G8929 Other chronic pain: Secondary | ICD-10-CM | POA: Diagnosis not present

## 2021-01-23 DIAGNOSIS — M0609 Rheumatoid arthritis without rheumatoid factor, multiple sites: Secondary | ICD-10-CM | POA: Diagnosis not present

## 2021-01-23 DIAGNOSIS — C220 Liver cell carcinoma: Secondary | ICD-10-CM | POA: Diagnosis not present

## 2021-01-23 DIAGNOSIS — I10 Essential (primary) hypertension: Secondary | ICD-10-CM | POA: Diagnosis not present

## 2021-01-23 DIAGNOSIS — E1151 Type 2 diabetes mellitus with diabetic peripheral angiopathy without gangrene: Secondary | ICD-10-CM | POA: Diagnosis not present

## 2021-01-23 DIAGNOSIS — L97812 Non-pressure chronic ulcer of other part of right lower leg with fat layer exposed: Secondary | ICD-10-CM | POA: Diagnosis not present

## 2021-01-23 DIAGNOSIS — I890 Lymphedema, not elsewhere classified: Secondary | ICD-10-CM | POA: Diagnosis not present

## 2021-01-23 DIAGNOSIS — S22000A Wedge compression fracture of unspecified thoracic vertebra, initial encounter for closed fracture: Secondary | ICD-10-CM | POA: Diagnosis not present

## 2021-01-23 DIAGNOSIS — I4891 Unspecified atrial fibrillation: Secondary | ICD-10-CM | POA: Diagnosis not present

## 2021-01-23 DIAGNOSIS — M810 Age-related osteoporosis without current pathological fracture: Secondary | ICD-10-CM | POA: Diagnosis not present

## 2021-01-23 DIAGNOSIS — I87311 Chronic venous hypertension (idiopathic) with ulcer of right lower extremity: Secondary | ICD-10-CM | POA: Diagnosis not present

## 2021-01-23 DIAGNOSIS — I2510 Atherosclerotic heart disease of native coronary artery without angina pectoris: Secondary | ICD-10-CM | POA: Diagnosis not present

## 2021-01-23 DIAGNOSIS — E11622 Type 2 diabetes mellitus with other skin ulcer: Secondary | ICD-10-CM | POA: Diagnosis not present

## 2021-01-24 DIAGNOSIS — M4854XD Collapsed vertebra, not elsewhere classified, thoracic region, subsequent encounter for fracture with routine healing: Secondary | ICD-10-CM | POA: Diagnosis not present

## 2021-01-24 DIAGNOSIS — E119 Type 2 diabetes mellitus without complications: Secondary | ICD-10-CM | POA: Diagnosis not present

## 2021-01-24 DIAGNOSIS — I10 Essential (primary) hypertension: Secondary | ICD-10-CM | POA: Diagnosis not present

## 2021-01-24 DIAGNOSIS — J9611 Chronic respiratory failure with hypoxia: Secondary | ICD-10-CM | POA: Diagnosis not present

## 2021-01-24 DIAGNOSIS — L97212 Non-pressure chronic ulcer of right calf with fat layer exposed: Secondary | ICD-10-CM | POA: Diagnosis not present

## 2021-01-24 DIAGNOSIS — I480 Paroxysmal atrial fibrillation: Secondary | ICD-10-CM | POA: Diagnosis not present

## 2021-01-24 DIAGNOSIS — I890 Lymphedema, not elsewhere classified: Secondary | ICD-10-CM | POA: Diagnosis not present

## 2021-01-24 DIAGNOSIS — I87313 Chronic venous hypertension (idiopathic) with ulcer of bilateral lower extremity: Secondary | ICD-10-CM | POA: Diagnosis not present

## 2021-01-24 DIAGNOSIS — J449 Chronic obstructive pulmonary disease, unspecified: Secondary | ICD-10-CM | POA: Diagnosis not present

## 2021-01-25 DIAGNOSIS — R002 Palpitations: Secondary | ICD-10-CM | POA: Diagnosis not present

## 2021-01-25 DIAGNOSIS — R9431 Abnormal electrocardiogram [ECG] [EKG]: Secondary | ICD-10-CM | POA: Diagnosis not present

## 2021-01-25 DIAGNOSIS — R739 Hyperglycemia, unspecified: Secondary | ICD-10-CM | POA: Diagnosis not present

## 2021-01-25 DIAGNOSIS — I959 Hypotension, unspecified: Secondary | ICD-10-CM | POA: Diagnosis not present

## 2021-01-25 DIAGNOSIS — R778 Other specified abnormalities of plasma proteins: Secondary | ICD-10-CM | POA: Diagnosis not present

## 2021-01-25 DIAGNOSIS — E119 Type 2 diabetes mellitus without complications: Secondary | ICD-10-CM | POA: Diagnosis not present

## 2021-01-25 DIAGNOSIS — I10 Essential (primary) hypertension: Secondary | ICD-10-CM | POA: Diagnosis not present

## 2021-01-25 DIAGNOSIS — E1142 Type 2 diabetes mellitus with diabetic polyneuropathy: Secondary | ICD-10-CM | POA: Diagnosis not present

## 2021-01-25 DIAGNOSIS — I480 Paroxysmal atrial fibrillation: Secondary | ICD-10-CM | POA: Diagnosis not present

## 2021-01-25 DIAGNOSIS — D509 Iron deficiency anemia, unspecified: Secondary | ICD-10-CM | POA: Diagnosis not present

## 2021-01-25 DIAGNOSIS — I248 Other forms of acute ischemic heart disease: Secondary | ICD-10-CM | POA: Diagnosis not present

## 2021-01-25 DIAGNOSIS — J449 Chronic obstructive pulmonary disease, unspecified: Secondary | ICD-10-CM | POA: Diagnosis not present

## 2021-01-25 DIAGNOSIS — I2510 Atherosclerotic heart disease of native coronary artery without angina pectoris: Secondary | ICD-10-CM | POA: Diagnosis not present

## 2021-01-25 DIAGNOSIS — R000 Tachycardia, unspecified: Secondary | ICD-10-CM | POA: Diagnosis not present

## 2021-01-25 DIAGNOSIS — K7460 Unspecified cirrhosis of liver: Secondary | ICD-10-CM | POA: Diagnosis not present

## 2021-01-25 DIAGNOSIS — R0789 Other chest pain: Secondary | ICD-10-CM | POA: Diagnosis not present

## 2021-01-25 DIAGNOSIS — R0902 Hypoxemia: Secondary | ICD-10-CM | POA: Diagnosis not present

## 2021-01-25 DIAGNOSIS — J9611 Chronic respiratory failure with hypoxia: Secondary | ICD-10-CM | POA: Diagnosis not present

## 2021-01-25 DIAGNOSIS — I4891 Unspecified atrial fibrillation: Secondary | ICD-10-CM | POA: Diagnosis not present

## 2021-01-25 DIAGNOSIS — E872 Acidosis, unspecified: Secondary | ICD-10-CM | POA: Diagnosis not present

## 2021-01-26 DIAGNOSIS — R002 Palpitations: Secondary | ICD-10-CM | POA: Diagnosis not present

## 2021-01-26 DIAGNOSIS — E10649 Type 1 diabetes mellitus with hypoglycemia without coma: Secondary | ICD-10-CM | POA: Diagnosis not present

## 2021-01-26 DIAGNOSIS — F32A Depression, unspecified: Secondary | ICD-10-CM | POA: Diagnosis not present

## 2021-01-26 DIAGNOSIS — I25118 Atherosclerotic heart disease of native coronary artery with other forms of angina pectoris: Secondary | ICD-10-CM | POA: Diagnosis not present

## 2021-01-26 DIAGNOSIS — I480 Paroxysmal atrial fibrillation: Secondary | ICD-10-CM | POA: Diagnosis not present

## 2021-01-26 DIAGNOSIS — R0789 Other chest pain: Secondary | ICD-10-CM | POA: Diagnosis not present

## 2021-01-27 DIAGNOSIS — R778 Other specified abnormalities of plasma proteins: Secondary | ICD-10-CM | POA: Diagnosis not present

## 2021-01-27 DIAGNOSIS — I248 Other forms of acute ischemic heart disease: Secondary | ICD-10-CM | POA: Diagnosis not present

## 2021-01-27 DIAGNOSIS — I4891 Unspecified atrial fibrillation: Secondary | ICD-10-CM | POA: Diagnosis not present

## 2021-01-27 DIAGNOSIS — K7460 Unspecified cirrhosis of liver: Secondary | ICD-10-CM | POA: Diagnosis not present

## 2021-01-27 DIAGNOSIS — J9611 Chronic respiratory failure with hypoxia: Secondary | ICD-10-CM | POA: Diagnosis not present

## 2021-01-27 DIAGNOSIS — R002 Palpitations: Secondary | ICD-10-CM | POA: Diagnosis not present

## 2021-01-27 DIAGNOSIS — I25118 Atherosclerotic heart disease of native coronary artery with other forms of angina pectoris: Secondary | ICD-10-CM | POA: Diagnosis not present

## 2021-01-27 DIAGNOSIS — D509 Iron deficiency anemia, unspecified: Secondary | ICD-10-CM | POA: Diagnosis not present

## 2021-01-27 DIAGNOSIS — I2510 Atherosclerotic heart disease of native coronary artery without angina pectoris: Secondary | ICD-10-CM | POA: Diagnosis not present

## 2021-01-27 DIAGNOSIS — I480 Paroxysmal atrial fibrillation: Secondary | ICD-10-CM | POA: Diagnosis not present

## 2021-01-27 DIAGNOSIS — E119 Type 2 diabetes mellitus without complications: Secondary | ICD-10-CM | POA: Diagnosis not present

## 2021-01-27 DIAGNOSIS — I959 Hypotension, unspecified: Secondary | ICD-10-CM | POA: Diagnosis not present

## 2021-01-27 DIAGNOSIS — E1142 Type 2 diabetes mellitus with diabetic polyneuropathy: Secondary | ICD-10-CM | POA: Diagnosis not present

## 2021-01-27 DIAGNOSIS — E872 Acidosis, unspecified: Secondary | ICD-10-CM | POA: Diagnosis not present

## 2021-01-28 DIAGNOSIS — E119 Type 2 diabetes mellitus without complications: Secondary | ICD-10-CM | POA: Diagnosis not present

## 2021-01-28 DIAGNOSIS — K7460 Unspecified cirrhosis of liver: Secondary | ICD-10-CM | POA: Diagnosis not present

## 2021-01-28 DIAGNOSIS — R002 Palpitations: Secondary | ICD-10-CM | POA: Diagnosis not present

## 2021-01-28 DIAGNOSIS — J9611 Chronic respiratory failure with hypoxia: Secondary | ICD-10-CM | POA: Diagnosis not present

## 2021-01-28 DIAGNOSIS — I4891 Unspecified atrial fibrillation: Secondary | ICD-10-CM | POA: Diagnosis not present

## 2021-01-28 DIAGNOSIS — D509 Iron deficiency anemia, unspecified: Secondary | ICD-10-CM | POA: Diagnosis not present

## 2021-01-28 DIAGNOSIS — I248 Other forms of acute ischemic heart disease: Secondary | ICD-10-CM | POA: Diagnosis not present

## 2021-01-28 DIAGNOSIS — I25118 Atherosclerotic heart disease of native coronary artery with other forms of angina pectoris: Secondary | ICD-10-CM | POA: Diagnosis not present

## 2021-01-28 DIAGNOSIS — E872 Acidosis, unspecified: Secondary | ICD-10-CM | POA: Diagnosis not present

## 2021-01-28 DIAGNOSIS — I480 Paroxysmal atrial fibrillation: Secondary | ICD-10-CM | POA: Diagnosis not present

## 2021-01-28 DIAGNOSIS — I959 Hypotension, unspecified: Secondary | ICD-10-CM | POA: Diagnosis not present

## 2021-01-28 DIAGNOSIS — R778 Other specified abnormalities of plasma proteins: Secondary | ICD-10-CM | POA: Diagnosis not present

## 2021-01-28 DIAGNOSIS — E1142 Type 2 diabetes mellitus with diabetic polyneuropathy: Secondary | ICD-10-CM | POA: Diagnosis not present

## 2021-01-28 DIAGNOSIS — I2510 Atherosclerotic heart disease of native coronary artery without angina pectoris: Secondary | ICD-10-CM | POA: Diagnosis not present

## 2021-01-30 DIAGNOSIS — I87311 Chronic venous hypertension (idiopathic) with ulcer of right lower extremity: Secondary | ICD-10-CM | POA: Diagnosis not present

## 2021-01-30 DIAGNOSIS — I890 Lymphedema, not elsewhere classified: Secondary | ICD-10-CM | POA: Diagnosis not present

## 2021-01-30 DIAGNOSIS — I739 Peripheral vascular disease, unspecified: Secondary | ICD-10-CM | POA: Diagnosis not present

## 2021-01-30 DIAGNOSIS — Z09 Encounter for follow-up examination after completed treatment for conditions other than malignant neoplasm: Secondary | ICD-10-CM | POA: Diagnosis not present

## 2021-01-30 DIAGNOSIS — L97812 Non-pressure chronic ulcer of other part of right lower leg with fat layer exposed: Secondary | ICD-10-CM | POA: Diagnosis not present

## 2021-01-31 DIAGNOSIS — I480 Paroxysmal atrial fibrillation: Secondary | ICD-10-CM | POA: Diagnosis not present

## 2021-01-31 DIAGNOSIS — C220 Liver cell carcinoma: Secondary | ICD-10-CM | POA: Diagnosis not present

## 2021-01-31 DIAGNOSIS — F411 Generalized anxiety disorder: Secondary | ICD-10-CM | POA: Diagnosis not present

## 2021-01-31 DIAGNOSIS — J9611 Chronic respiratory failure with hypoxia: Secondary | ICD-10-CM | POA: Diagnosis not present

## 2021-01-31 DIAGNOSIS — E663 Overweight: Secondary | ICD-10-CM | POA: Diagnosis not present

## 2021-01-31 DIAGNOSIS — Z9981 Dependence on supplemental oxygen: Secondary | ICD-10-CM | POA: Diagnosis not present

## 2021-01-31 DIAGNOSIS — J411 Mucopurulent chronic bronchitis: Secondary | ICD-10-CM | POA: Diagnosis not present

## 2021-01-31 DIAGNOSIS — D6869 Other thrombophilia: Secondary | ICD-10-CM | POA: Diagnosis not present

## 2021-01-31 DIAGNOSIS — J449 Chronic obstructive pulmonary disease, unspecified: Secondary | ICD-10-CM | POA: Diagnosis not present

## 2021-01-31 DIAGNOSIS — M1991 Primary osteoarthritis, unspecified site: Secondary | ICD-10-CM | POA: Diagnosis not present

## 2021-01-31 DIAGNOSIS — K7460 Unspecified cirrhosis of liver: Secondary | ICD-10-CM | POA: Diagnosis not present

## 2021-01-31 DIAGNOSIS — G8929 Other chronic pain: Secondary | ICD-10-CM | POA: Diagnosis not present

## 2021-02-11 DIAGNOSIS — E7800 Pure hypercholesterolemia, unspecified: Secondary | ICD-10-CM | POA: Diagnosis not present

## 2021-02-11 DIAGNOSIS — R0902 Hypoxemia: Secondary | ICD-10-CM | POA: Diagnosis not present

## 2021-02-11 DIAGNOSIS — N390 Urinary tract infection, site not specified: Secondary | ICD-10-CM | POA: Diagnosis not present

## 2021-02-11 DIAGNOSIS — M545 Low back pain, unspecified: Secondary | ICD-10-CM | POA: Diagnosis not present

## 2021-02-11 DIAGNOSIS — M549 Dorsalgia, unspecified: Secondary | ICD-10-CM | POA: Diagnosis not present

## 2021-02-11 DIAGNOSIS — M5136 Other intervertebral disc degeneration, lumbar region: Secondary | ICD-10-CM | POA: Diagnosis not present

## 2021-02-11 DIAGNOSIS — I10 Essential (primary) hypertension: Secondary | ICD-10-CM | POA: Diagnosis not present

## 2021-02-11 DIAGNOSIS — E119 Type 2 diabetes mellitus without complications: Secondary | ICD-10-CM | POA: Diagnosis not present

## 2021-02-11 DIAGNOSIS — Z87891 Personal history of nicotine dependence: Secondary | ICD-10-CM | POA: Diagnosis not present

## 2021-02-11 DIAGNOSIS — Z7982 Long term (current) use of aspirin: Secondary | ICD-10-CM | POA: Diagnosis not present

## 2021-02-11 DIAGNOSIS — J449 Chronic obstructive pulmonary disease, unspecified: Secondary | ICD-10-CM | POA: Diagnosis not present

## 2021-02-11 DIAGNOSIS — I2510 Atherosclerotic heart disease of native coronary artery without angina pectoris: Secondary | ICD-10-CM | POA: Diagnosis not present

## 2021-02-11 DIAGNOSIS — I480 Paroxysmal atrial fibrillation: Secondary | ICD-10-CM | POA: Diagnosis not present

## 2021-02-11 DIAGNOSIS — I959 Hypotension, unspecified: Secondary | ICD-10-CM | POA: Diagnosis not present

## 2021-02-16 DIAGNOSIS — R0602 Shortness of breath: Secondary | ICD-10-CM | POA: Diagnosis not present

## 2021-02-16 DIAGNOSIS — I252 Old myocardial infarction: Secondary | ICD-10-CM | POA: Diagnosis not present

## 2021-02-16 DIAGNOSIS — R0789 Other chest pain: Secondary | ICD-10-CM | POA: Diagnosis not present

## 2021-02-16 DIAGNOSIS — U071 COVID-19: Secondary | ICD-10-CM | POA: Diagnosis not present

## 2021-02-16 DIAGNOSIS — E039 Hypothyroidism, unspecified: Secondary | ICD-10-CM | POA: Diagnosis not present

## 2021-02-16 DIAGNOSIS — I959 Hypotension, unspecified: Secondary | ICD-10-CM | POA: Diagnosis not present

## 2021-02-16 DIAGNOSIS — M069 Rheumatoid arthritis, unspecified: Secondary | ICD-10-CM | POA: Diagnosis not present

## 2021-02-16 DIAGNOSIS — M545 Low back pain, unspecified: Secondary | ICD-10-CM | POA: Diagnosis not present

## 2021-02-16 DIAGNOSIS — I10 Essential (primary) hypertension: Secondary | ICD-10-CM | POA: Diagnosis not present

## 2021-02-16 DIAGNOSIS — J449 Chronic obstructive pulmonary disease, unspecified: Secondary | ICD-10-CM | POA: Diagnosis not present

## 2021-02-16 DIAGNOSIS — M549 Dorsalgia, unspecified: Secondary | ICD-10-CM | POA: Diagnosis not present

## 2021-02-16 DIAGNOSIS — R079 Chest pain, unspecified: Secondary | ICD-10-CM | POA: Diagnosis not present

## 2021-02-16 DIAGNOSIS — E119 Type 2 diabetes mellitus without complications: Secondary | ICD-10-CM | POA: Diagnosis not present

## 2021-02-20 DIAGNOSIS — M79605 Pain in left leg: Secondary | ICD-10-CM | POA: Diagnosis not present

## 2021-02-20 DIAGNOSIS — M1712 Unilateral primary osteoarthritis, left knee: Secondary | ICD-10-CM | POA: Diagnosis not present

## 2021-02-20 DIAGNOSIS — W19XXXA Unspecified fall, initial encounter: Secondary | ICD-10-CM | POA: Diagnosis not present

## 2021-02-20 DIAGNOSIS — I6782 Cerebral ischemia: Secondary | ICD-10-CM | POA: Diagnosis not present

## 2021-02-20 DIAGNOSIS — I959 Hypotension, unspecified: Secondary | ICD-10-CM | POA: Diagnosis not present

## 2021-02-20 DIAGNOSIS — M1612 Unilateral primary osteoarthritis, left hip: Secondary | ICD-10-CM | POA: Diagnosis not present

## 2021-02-20 DIAGNOSIS — S81812A Laceration without foreign body, left lower leg, initial encounter: Secondary | ICD-10-CM | POA: Diagnosis not present

## 2021-02-21 DIAGNOSIS — J441 Chronic obstructive pulmonary disease with (acute) exacerbation: Secondary | ICD-10-CM | POA: Diagnosis not present

## 2021-02-21 DIAGNOSIS — R531 Weakness: Secondary | ICD-10-CM | POA: Diagnosis not present

## 2021-02-21 DIAGNOSIS — K5521 Angiodysplasia of colon with hemorrhage: Secondary | ICD-10-CM | POA: Diagnosis not present

## 2021-02-21 DIAGNOSIS — K7460 Unspecified cirrhosis of liver: Secondary | ICD-10-CM | POA: Diagnosis not present

## 2021-02-21 DIAGNOSIS — J1282 Pneumonia due to coronavirus disease 2019: Secondary | ICD-10-CM | POA: Diagnosis not present

## 2021-02-21 DIAGNOSIS — D649 Anemia, unspecified: Secondary | ICD-10-CM | POA: Diagnosis not present

## 2021-02-21 DIAGNOSIS — I25118 Atherosclerotic heart disease of native coronary artery with other forms of angina pectoris: Secondary | ICD-10-CM | POA: Diagnosis not present

## 2021-02-21 DIAGNOSIS — J180 Bronchopneumonia, unspecified organism: Secondary | ICD-10-CM | POA: Diagnosis not present

## 2021-02-21 DIAGNOSIS — D509 Iron deficiency anemia, unspecified: Secondary | ICD-10-CM | POA: Diagnosis not present

## 2021-02-21 DIAGNOSIS — S81812A Laceration without foreign body, left lower leg, initial encounter: Secondary | ICD-10-CM | POA: Diagnosis not present

## 2021-02-21 DIAGNOSIS — B952 Enterococcus as the cause of diseases classified elsewhere: Secondary | ICD-10-CM | POA: Diagnosis not present

## 2021-02-21 DIAGNOSIS — Z515 Encounter for palliative care: Secondary | ICD-10-CM | POA: Diagnosis not present

## 2021-02-21 DIAGNOSIS — N179 Acute kidney failure, unspecified: Secondary | ICD-10-CM | POA: Diagnosis not present

## 2021-02-21 DIAGNOSIS — R0600 Dyspnea, unspecified: Secondary | ICD-10-CM | POA: Diagnosis not present

## 2021-02-21 DIAGNOSIS — E1142 Type 2 diabetes mellitus with diabetic polyneuropathy: Secondary | ICD-10-CM | POA: Diagnosis not present

## 2021-02-21 DIAGNOSIS — S2243XA Multiple fractures of ribs, bilateral, initial encounter for closed fracture: Secondary | ICD-10-CM | POA: Diagnosis not present

## 2021-02-21 DIAGNOSIS — J439 Emphysema, unspecified: Secondary | ICD-10-CM | POA: Diagnosis not present

## 2021-02-21 DIAGNOSIS — B964 Proteus (mirabilis) (morganii) as the cause of diseases classified elsewhere: Secondary | ICD-10-CM | POA: Diagnosis not present

## 2021-02-21 DIAGNOSIS — A419 Sepsis, unspecified organism: Secondary | ICD-10-CM | POA: Diagnosis not present

## 2021-02-21 DIAGNOSIS — I4891 Unspecified atrial fibrillation: Secondary | ICD-10-CM | POA: Diagnosis not present

## 2021-02-21 DIAGNOSIS — N183 Chronic kidney disease, stage 3 unspecified: Secondary | ICD-10-CM | POA: Diagnosis not present

## 2021-02-21 DIAGNOSIS — S2232XA Fracture of one rib, left side, initial encounter for closed fracture: Secondary | ICD-10-CM | POA: Diagnosis not present

## 2021-02-21 DIAGNOSIS — R58 Hemorrhage, not elsewhere classified: Secondary | ICD-10-CM | POA: Diagnosis not present

## 2021-02-21 DIAGNOSIS — R062 Wheezing: Secondary | ICD-10-CM | POA: Diagnosis not present

## 2021-02-21 DIAGNOSIS — J449 Chronic obstructive pulmonary disease, unspecified: Secondary | ICD-10-CM | POA: Diagnosis not present

## 2021-02-21 DIAGNOSIS — M79605 Pain in left leg: Secondary | ICD-10-CM | POA: Diagnosis not present

## 2021-02-21 DIAGNOSIS — K922 Gastrointestinal hemorrhage, unspecified: Secondary | ICD-10-CM | POA: Diagnosis not present

## 2021-02-21 DIAGNOSIS — N3001 Acute cystitis with hematuria: Secondary | ICD-10-CM | POA: Diagnosis not present

## 2021-02-21 DIAGNOSIS — I480 Paroxysmal atrial fibrillation: Secondary | ICD-10-CM | POA: Diagnosis not present

## 2021-02-21 DIAGNOSIS — R0689 Other abnormalities of breathing: Secondary | ICD-10-CM | POA: Diagnosis not present

## 2021-02-21 DIAGNOSIS — I6782 Cerebral ischemia: Secondary | ICD-10-CM | POA: Diagnosis not present

## 2021-02-21 DIAGNOSIS — R739 Hyperglycemia, unspecified: Secondary | ICD-10-CM | POA: Diagnosis not present

## 2021-02-21 DIAGNOSIS — R0902 Hypoxemia: Secondary | ICD-10-CM | POA: Diagnosis not present

## 2021-02-21 DIAGNOSIS — J9611 Chronic respiratory failure with hypoxia: Secondary | ICD-10-CM | POA: Diagnosis not present

## 2021-02-21 DIAGNOSIS — M1712 Unilateral primary osteoarthritis, left knee: Secondary | ICD-10-CM | POA: Diagnosis not present

## 2021-02-21 DIAGNOSIS — C220 Liver cell carcinoma: Secondary | ICD-10-CM | POA: Diagnosis not present

## 2021-02-21 DIAGNOSIS — U071 COVID-19: Secondary | ICD-10-CM | POA: Diagnosis not present

## 2021-02-21 DIAGNOSIS — M1612 Unilateral primary osteoarthritis, left hip: Secondary | ICD-10-CM | POA: Diagnosis not present

## 2021-03-07 DIAGNOSIS — I25118 Atherosclerotic heart disease of native coronary artery with other forms of angina pectoris: Secondary | ICD-10-CM | POA: Diagnosis not present

## 2021-03-07 DIAGNOSIS — I480 Paroxysmal atrial fibrillation: Secondary | ICD-10-CM | POA: Diagnosis not present

## 2021-03-08 DIAGNOSIS — E1169 Type 2 diabetes mellitus with other specified complication: Secondary | ICD-10-CM | POA: Diagnosis not present

## 2021-03-08 DIAGNOSIS — I480 Paroxysmal atrial fibrillation: Secondary | ICD-10-CM | POA: Diagnosis not present

## 2021-03-08 DIAGNOSIS — E1122 Type 2 diabetes mellitus with diabetic chronic kidney disease: Secondary | ICD-10-CM | POA: Diagnosis not present

## 2021-03-08 DIAGNOSIS — J411 Mucopurulent chronic bronchitis: Secondary | ICD-10-CM | POA: Diagnosis not present

## 2021-03-08 DIAGNOSIS — C220 Liver cell carcinoma: Secondary | ICD-10-CM | POA: Diagnosis not present

## 2021-03-08 DIAGNOSIS — N1832 Chronic kidney disease, stage 3b: Secondary | ICD-10-CM | POA: Diagnosis not present

## 2021-03-08 DIAGNOSIS — S81819D Laceration without foreign body, unspecified lower leg, subsequent encounter: Secondary | ICD-10-CM | POA: Diagnosis not present

## 2021-03-08 DIAGNOSIS — E7800 Pure hypercholesterolemia, unspecified: Secondary | ICD-10-CM | POA: Diagnosis not present

## 2021-03-08 DIAGNOSIS — N183 Chronic kidney disease, stage 3 unspecified: Secondary | ICD-10-CM | POA: Diagnosis not present

## 2021-03-13 DIAGNOSIS — J9610 Chronic respiratory failure, unspecified whether with hypoxia or hypercapnia: Secondary | ICD-10-CM | POA: Diagnosis not present

## 2021-03-13 DIAGNOSIS — R059 Cough, unspecified: Secondary | ICD-10-CM | POA: Diagnosis not present

## 2021-03-13 DIAGNOSIS — J449 Chronic obstructive pulmonary disease, unspecified: Secondary | ICD-10-CM | POA: Diagnosis not present

## 2021-03-13 DIAGNOSIS — C220 Liver cell carcinoma: Secondary | ICD-10-CM | POA: Diagnosis not present

## 2021-03-15 DIAGNOSIS — I480 Paroxysmal atrial fibrillation: Secondary | ICD-10-CM | POA: Diagnosis not present

## 2021-03-15 DIAGNOSIS — M47814 Spondylosis without myelopathy or radiculopathy, thoracic region: Secondary | ICD-10-CM | POA: Diagnosis not present

## 2021-03-15 DIAGNOSIS — M810 Age-related osteoporosis without current pathological fracture: Secondary | ICD-10-CM | POA: Diagnosis not present

## 2021-03-15 DIAGNOSIS — J449 Chronic obstructive pulmonary disease, unspecified: Secondary | ICD-10-CM | POA: Diagnosis not present

## 2021-03-15 DIAGNOSIS — S22000A Wedge compression fracture of unspecified thoracic vertebra, initial encounter for closed fracture: Secondary | ICD-10-CM | POA: Diagnosis not present

## 2021-03-15 DIAGNOSIS — M4804 Spinal stenosis, thoracic region: Secondary | ICD-10-CM | POA: Diagnosis not present

## 2021-03-19 DIAGNOSIS — J9610 Chronic respiratory failure, unspecified whether with hypoxia or hypercapnia: Secondary | ICD-10-CM | POA: Diagnosis not present

## 2021-03-22 DIAGNOSIS — N390 Urinary tract infection, site not specified: Secondary | ICD-10-CM | POA: Diagnosis not present

## 2021-03-22 DIAGNOSIS — Z1231 Encounter for screening mammogram for malignant neoplasm of breast: Secondary | ICD-10-CM | POA: Diagnosis not present

## 2021-03-22 DIAGNOSIS — Z124 Encounter for screening for malignant neoplasm of cervix: Secondary | ICD-10-CM | POA: Diagnosis not present

## 2021-03-25 DIAGNOSIS — R0902 Hypoxemia: Secondary | ICD-10-CM | POA: Diagnosis not present

## 2021-03-25 DIAGNOSIS — I5032 Chronic diastolic (congestive) heart failure: Secondary | ICD-10-CM | POA: Diagnosis not present

## 2021-03-25 DIAGNOSIS — L97219 Non-pressure chronic ulcer of right calf with unspecified severity: Secondary | ICD-10-CM | POA: Diagnosis not present

## 2021-03-25 DIAGNOSIS — N179 Acute kidney failure, unspecified: Secondary | ICD-10-CM | POA: Diagnosis not present

## 2021-03-25 DIAGNOSIS — F419 Anxiety disorder, unspecified: Secondary | ICD-10-CM | POA: Diagnosis not present

## 2021-03-25 DIAGNOSIS — S2242XA Multiple fractures of ribs, left side, initial encounter for closed fracture: Secondary | ICD-10-CM | POA: Diagnosis not present

## 2021-03-25 DIAGNOSIS — I951 Orthostatic hypotension: Secondary | ICD-10-CM | POA: Diagnosis not present

## 2021-03-25 DIAGNOSIS — Z20822 Contact with and (suspected) exposure to covid-19: Secondary | ICD-10-CM | POA: Diagnosis not present

## 2021-03-25 DIAGNOSIS — J479 Bronchiectasis, uncomplicated: Secondary | ICD-10-CM | POA: Diagnosis not present

## 2021-03-25 DIAGNOSIS — R6521 Severe sepsis with septic shock: Secondary | ICD-10-CM | POA: Diagnosis not present

## 2021-03-25 DIAGNOSIS — L89152 Pressure ulcer of sacral region, stage 2: Secondary | ICD-10-CM | POA: Diagnosis not present

## 2021-03-25 DIAGNOSIS — C220 Liver cell carcinoma: Secondary | ICD-10-CM | POA: Diagnosis not present

## 2021-03-25 DIAGNOSIS — I2510 Atherosclerotic heart disease of native coronary artery without angina pectoris: Secondary | ICD-10-CM | POA: Diagnosis not present

## 2021-03-25 DIAGNOSIS — S81802A Unspecified open wound, left lower leg, initial encounter: Secondary | ICD-10-CM | POA: Diagnosis not present

## 2021-03-25 DIAGNOSIS — N3000 Acute cystitis without hematuria: Secondary | ICD-10-CM | POA: Diagnosis not present

## 2021-03-25 DIAGNOSIS — J189 Pneumonia, unspecified organism: Secondary | ICD-10-CM | POA: Diagnosis not present

## 2021-03-25 DIAGNOSIS — K7460 Unspecified cirrhosis of liver: Secondary | ICD-10-CM | POA: Diagnosis not present

## 2021-03-25 DIAGNOSIS — E039 Hypothyroidism, unspecified: Secondary | ICD-10-CM | POA: Diagnosis not present

## 2021-03-25 DIAGNOSIS — I480 Paroxysmal atrial fibrillation: Secondary | ICD-10-CM | POA: Diagnosis not present

## 2021-03-25 DIAGNOSIS — I959 Hypotension, unspecified: Secondary | ICD-10-CM | POA: Diagnosis not present

## 2021-03-25 DIAGNOSIS — N170 Acute kidney failure with tubular necrosis: Secondary | ICD-10-CM | POA: Diagnosis not present

## 2021-03-25 DIAGNOSIS — L97811 Non-pressure chronic ulcer of other part of right lower leg limited to breakdown of skin: Secondary | ICD-10-CM | POA: Diagnosis not present

## 2021-03-25 DIAGNOSIS — E1165 Type 2 diabetes mellitus with hyperglycemia: Secondary | ICD-10-CM | POA: Diagnosis not present

## 2021-03-25 DIAGNOSIS — R531 Weakness: Secondary | ICD-10-CM | POA: Diagnosis not present

## 2021-03-25 DIAGNOSIS — L89329 Pressure ulcer of left buttock, unspecified stage: Secondary | ICD-10-CM | POA: Diagnosis not present

## 2021-03-25 DIAGNOSIS — J470 Bronchiectasis with acute lower respiratory infection: Secondary | ICD-10-CM | POA: Diagnosis not present

## 2021-03-25 DIAGNOSIS — Z4682 Encounter for fitting and adjustment of non-vascular catheter: Secondary | ICD-10-CM | POA: Diagnosis not present

## 2021-03-25 DIAGNOSIS — R0603 Acute respiratory distress: Secondary | ICD-10-CM | POA: Diagnosis not present

## 2021-03-25 DIAGNOSIS — J9621 Acute and chronic respiratory failure with hypoxia: Secondary | ICD-10-CM | POA: Diagnosis not present

## 2021-03-25 DIAGNOSIS — J9690 Respiratory failure, unspecified, unspecified whether with hypoxia or hypercapnia: Secondary | ICD-10-CM | POA: Diagnosis not present

## 2021-03-25 DIAGNOSIS — G9340 Encephalopathy, unspecified: Secondary | ICD-10-CM | POA: Diagnosis not present

## 2021-03-25 DIAGNOSIS — I25118 Atherosclerotic heart disease of native coronary artery with other forms of angina pectoris: Secondary | ICD-10-CM | POA: Diagnosis not present

## 2021-04-10 DEATH — deceased
